# Patient Record
Sex: Female | Born: 1950 | Race: White | Hispanic: No | Marital: Married | Smoking: Never smoker
Health system: Southern US, Community
[De-identification: ages and names within clinical notes are randomized; demographics above are authoritative.]

## PROBLEM LIST (undated history)

## (undated) DIAGNOSIS — F419 Anxiety disorder, unspecified: Secondary | ICD-10-CM

## (undated) DIAGNOSIS — E785 Hyperlipidemia, unspecified: Secondary | ICD-10-CM

## (undated) DIAGNOSIS — H269 Unspecified cataract: Secondary | ICD-10-CM

## (undated) DIAGNOSIS — K219 Gastro-esophageal reflux disease without esophagitis: Secondary | ICD-10-CM

## (undated) HISTORY — DX: Unspecified cataract: H26.9

## (undated) HISTORY — PX: NO PAST SURGERIES: SHX2092

## (undated) HISTORY — PX: BREAST CYST ASPIRATION: SHX578

## (undated) HISTORY — DX: Anxiety disorder, unspecified: F41.9

## (undated) HISTORY — PX: BREAST BIOPSY: SHX20

---

## 2016-09-15 DIAGNOSIS — Z1231 Encounter for screening mammogram for malignant neoplasm of breast: Secondary | ICD-10-CM | POA: Diagnosis not present

## 2016-09-16 DIAGNOSIS — Z124 Encounter for screening for malignant neoplasm of cervix: Secondary | ICD-10-CM | POA: Diagnosis not present

## 2016-09-16 DIAGNOSIS — F411 Generalized anxiety disorder: Secondary | ICD-10-CM | POA: Diagnosis not present

## 2016-09-16 DIAGNOSIS — N952 Postmenopausal atrophic vaginitis: Secondary | ICD-10-CM | POA: Diagnosis not present

## 2016-09-16 DIAGNOSIS — R87610 Atypical squamous cells of undetermined significance on cytologic smear of cervix (ASC-US): Secondary | ICD-10-CM | POA: Diagnosis not present

## 2016-09-16 LAB — HM PAP SMEAR: HM Pap smear: NEGATIVE

## 2016-09-26 DIAGNOSIS — R928 Other abnormal and inconclusive findings on diagnostic imaging of breast: Secondary | ICD-10-CM | POA: Diagnosis not present

## 2016-09-26 LAB — HM MAMMOGRAPHY

## 2016-10-06 DIAGNOSIS — L219 Seborrheic dermatitis, unspecified: Secondary | ICD-10-CM | POA: Diagnosis not present

## 2016-10-06 DIAGNOSIS — B078 Other viral warts: Secondary | ICD-10-CM | POA: Diagnosis not present

## 2016-12-10 DIAGNOSIS — R030 Elevated blood-pressure reading, without diagnosis of hypertension: Secondary | ICD-10-CM | POA: Diagnosis not present

## 2016-12-10 DIAGNOSIS — Z8639 Personal history of other endocrine, nutritional and metabolic disease: Secondary | ICD-10-CM | POA: Diagnosis not present

## 2016-12-10 DIAGNOSIS — M6740 Ganglion, unspecified site: Secondary | ICD-10-CM | POA: Diagnosis not present

## 2016-12-10 DIAGNOSIS — M797 Fibromyalgia: Secondary | ICD-10-CM | POA: Diagnosis not present

## 2016-12-24 DIAGNOSIS — Z1159 Encounter for screening for other viral diseases: Secondary | ICD-10-CM | POA: Diagnosis not present

## 2016-12-24 DIAGNOSIS — R7303 Prediabetes: Secondary | ICD-10-CM | POA: Diagnosis not present

## 2016-12-24 DIAGNOSIS — Z8639 Personal history of other endocrine, nutritional and metabolic disease: Secondary | ICD-10-CM | POA: Diagnosis not present

## 2016-12-24 DIAGNOSIS — Z0000 Encounter for general adult medical examination without abnormal findings: Secondary | ICD-10-CM | POA: Diagnosis not present

## 2016-12-24 DIAGNOSIS — E785 Hyperlipidemia, unspecified: Secondary | ICD-10-CM | POA: Diagnosis not present

## 2016-12-24 DIAGNOSIS — I10 Essential (primary) hypertension: Secondary | ICD-10-CM | POA: Diagnosis not present

## 2016-12-24 DIAGNOSIS — M797 Fibromyalgia: Secondary | ICD-10-CM | POA: Diagnosis not present

## 2016-12-24 LAB — HM HEPATITIS C SCREENING LAB: HM Hepatitis Screen: NEGATIVE

## 2017-02-04 DIAGNOSIS — I10 Essential (primary) hypertension: Secondary | ICD-10-CM | POA: Diagnosis not present

## 2017-07-07 DIAGNOSIS — Z23 Encounter for immunization: Secondary | ICD-10-CM | POA: Diagnosis not present

## 2017-10-13 ENCOUNTER — Ambulatory Visit (INDEPENDENT_AMBULATORY_CARE_PROVIDER_SITE_OTHER): Payer: Medicare Other | Admitting: Physician Assistant

## 2017-10-13 ENCOUNTER — Encounter: Payer: Self-pay | Admitting: Physician Assistant

## 2017-10-13 VITALS — BP 150/90 | HR 68 | Temp 98.5°F | Resp 16 | Ht 62.0 in | Wt 142.0 lb

## 2017-10-13 DIAGNOSIS — Z780 Asymptomatic menopausal state: Secondary | ICD-10-CM | POA: Diagnosis not present

## 2017-10-13 DIAGNOSIS — Z1239 Encounter for other screening for malignant neoplasm of breast: Secondary | ICD-10-CM

## 2017-10-13 DIAGNOSIS — I10 Essential (primary) hypertension: Secondary | ICD-10-CM

## 2017-10-13 DIAGNOSIS — Z0001 Encounter for general adult medical examination with abnormal findings: Secondary | ICD-10-CM | POA: Diagnosis not present

## 2017-10-13 DIAGNOSIS — Z8639 Personal history of other endocrine, nutritional and metabolic disease: Secondary | ICD-10-CM

## 2017-10-13 DIAGNOSIS — H04123 Dry eye syndrome of bilateral lacrimal glands: Secondary | ICD-10-CM

## 2017-10-13 DIAGNOSIS — M6740 Ganglion, unspecified site: Secondary | ICD-10-CM | POA: Diagnosis not present

## 2017-10-13 DIAGNOSIS — Z1211 Encounter for screening for malignant neoplasm of colon: Secondary | ICD-10-CM | POA: Diagnosis not present

## 2017-10-13 DIAGNOSIS — Z1231 Encounter for screening mammogram for malignant neoplasm of breast: Secondary | ICD-10-CM

## 2017-10-13 DIAGNOSIS — E785 Hyperlipidemia, unspecified: Secondary | ICD-10-CM

## 2017-10-13 DIAGNOSIS — R5383 Other fatigue: Secondary | ICD-10-CM | POA: Diagnosis not present

## 2017-10-13 DIAGNOSIS — G4700 Insomnia, unspecified: Secondary | ICD-10-CM | POA: Diagnosis not present

## 2017-10-13 MED ORDER — TRAZODONE HCL 50 MG PO TABS
25.0000 mg | ORAL_TABLET | Freq: Every evening | ORAL | 3 refills | Status: DC | PRN
Start: 2017-10-13 — End: 2017-12-07

## 2017-10-13 NOTE — Patient Instructions (Signed)

## 2017-10-13 NOTE — Progress Notes (Addendum)
Patient: Melanie Rhodes Female    DOB: 1951/03/03   67 y.o.   MRN: [id number] Visit Date: 10/14/2017  Today's Provider: Trinna Post, [name]   Chief Complaint  Patient presents with  . Establish Care   Subjective:    HPI   Melanie Rhodes is a 67 y/o woman presenting today to establish care. She was previously seen in [place], [place], now lives in [place]. She is a retired Glass blower/designer.  She takes restasis for dry eyes.   She said she was previously on 2.5 mg amlodipine previously for elevated BP but her home readings have been normal. She brings in BP log today.  She says she has a history of Graves disease and was on methimazole, but that this had resolved and she hasn't been on any sort of thyroid medication in a while. No history of iodine ablation or thyroidectomy.   She reports she has a cyst on her left foot that has been present for years. She reports she has been told that this is a ganglion cyst. She also reports that she has issues with plantar fasciitis.   She has a history of fibromyalgia. She is currently not on medication for this. She exercises daily by walking 2-3 miles. She does have all over body pain.  She has a history of using klonopin for sleep and also restless legs. She hasn't used klonopin in months however. She is open to trying other medications for sleep aid. She reports at one time she may have tried Requip for restless legs but that it made her nauseated.  She is due for a mammogram. She is due for a DEXA, never had one before. She thinks she had the first pneumonia shot. Declines hepatitis C/HIV screening, says she has gotten this. She is overdue for colonoscopy. She is 66 and no longer requires PAP smears, did not have abnormal PAP. She says she had a tetanus shot within the past 10 years. She reports she had the first pneumonia vaccination, also had zostavax.     No Known Allergies   Current Outpatient Medications:  .  Biotin 1000 MCG  CHEW, Chew by mouth., Disp: , Rfl:  .  cetirizine (KLS ALLER-TEC) 10 MG tablet, Take 10 mg by mouth daily., Disp: , Rfl:  .  Cholecalciferol (VITAMIN D3) 400 units CAPS, Take by mouth., Disp: , Rfl:  .  clonazePAM (KLONOPIN) 1 MG tablet, Take 1 mg by mouth 2 (two) times daily., Disp: , Rfl:  .  cycloSPORINE (RESTASIS) 0.05 % ophthalmic emulsion, 1 drop 2 (two) times daily., Disp: , Rfl:  .  omeprazole (PRILOSEC) 20 MG capsule, Take 20 mg by mouth daily., Disp: , Rfl:  .  vitamin C (ASCORBIC ACID) 500 MG tablet, Take 500 mg by mouth daily., Disp: , Rfl:  .  FLUAD 0.5 ML SUSY, ADM 0.5ML IM UTD, Disp: , Rfl: 0 .  traZODone (DESYREL) 50 MG tablet, Take 0.5-1 tablets (25-50 mg total) by mouth at bedtime as needed for sleep., Disp: 30 tablet, Rfl: 3  Review of Systems  Constitutional: Positive for diaphoresis and fatigue. Negative for activity change, appetite change, chills and fever.  HENT: Positive for postnasal drip, rhinorrhea, sinus pressure and tinnitus. Negative for congestion, dental problem, drooling, ear discharge, ear pain, facial swelling, hearing loss, mouth sores, nosebleeds, sinus pain, sneezing, sore throat, trouble swallowing and voice change.   Eyes: Negative.   Respiratory: Negative.   Cardiovascular: Negative.   Gastrointestinal:  Negative.   Endocrine: Positive for cold intolerance, heat intolerance and polydipsia. Negative for polyphagia and polyuria.  Genitourinary: Negative.   Musculoskeletal: Positive for arthralgias, back pain, myalgias, neck pain and neck stiffness.       History of Fibromyalgia.   Skin: Positive for rash. Negative for color change, pallor and wound.  Allergic/Immunologic: Negative.   Neurological: Positive for dizziness. Negative for tremors, seizures, syncope, facial asymmetry, speech difficulty, weakness, light-headedness, numbness and headaches.  Hematological: Negative.   Psychiatric/Behavioral: Positive for decreased concentration and sleep  disturbance. Negative for agitation, behavioral problems, confusion, dysphoric mood, hallucinations, self-injury and suicidal ideas. The patient is nervous/anxious. The patient is not hyperactive.    Family History  Problem Relation Age of Onset  . GER disease Mother   . Hypertension Mother   . GER disease Father   . Hyperlipidemia Father   . Diabetes Brother    Past Surgical History:  Procedure Laterality Date  . BREAST CYST ASPIRATION Left years ago   neg  . COLONOSCOPY WITH PROPOFOL N/A 11/09/2017   Procedure: COLONOSCOPY WITH PROPOFOL;  Surgeon: Jonathon Bellows, MD;  Location: [facility] ENDOSCOPY;  Service: Gastroenterology;  Laterality: N/A;  . NO PAST SURGERIES      Social History   Tobacco Use  . Smoking status: Never Smoker  . Smokeless tobacco: Never Used  Substance Use Topics  . Alcohol use: Yes    Alcohol/week: 6.0 oz    Types: 7 Glasses of wine, 3 Shots of liquor per week    Frequency: Never   Objective:   BP (!) 150/90   Pulse 68   Temp 98.5 F (36.9 C) (Oral)   Resp 16   Ht 5\' 2"  (1.575 m)   Wt 142 lb (64.4 kg)   BMI 25.97 kg/m  Vitals:   10/13/17 1448 10/14/17 1143  BP: (!) 178/94 (!) 150/90  Pulse: 68   Resp: 16   Temp: 98.5 F (36.9 C)   TempSrc: Oral   Weight: 142 lb (64.4 kg)   Height: 5\' 2"  (1.575 m)    Depression screen [facility] 2/9 10/15/2017 10/15/2017  Decreased Interest 0 0  Down, Depressed, Hopeless 0 0  PHQ - 2 Score 0 0  Altered sleeping - 3  Tired, decreased energy - 1  Change in appetite - 0  Feeling bad or failure about yourself  - 0  Trouble concentrating - 0  Moving slowly or fidgety/restless - 0  Suicidal thoughts - 0  PHQ-9 Score - 4  Difficult doing work/chores - Not difficult at all      Physical Exam  Constitutional: She is oriented to person, place, and time. She appears well-developed and well-nourished.  HENT:  Right Ear: External ear normal.  Left Ear: External ear normal.  Mouth/Throat: Oropharynx is clear and moist. No  oropharyngeal exudate.  Eyes: Conjunctivae are normal.  Neck: Neck supple.  Cardiovascular: Normal rate and regular rhythm.  Pulmonary/Chest: Effort normal and breath sounds normal.  Abdominal: Soft. Bowel sounds are normal.  Musculoskeletal:  Small, well circumscribed cystic lesion on the lateral dorsal aspect of her left foot. Slightly fluctuant and mobile.   Lymphadenopathy:    She has no cervical adenopathy.  Neurological: She is alert and oriented to person, place, and time.  Skin: Skin is warm and dry.  Psychiatric: She has a normal mood and affect. Her behavior is normal.        Assessment & Plan:     1. Annual physical exam  Will need  records from previous PCP, can schedule AWV when they come on to get second pneumonia shot.  2. Hypertension, unspecified type  She can restart her amlodipine 2.5 mg. Will refill as necessary.   - Comprehensive Metabolic Panel (CMET)  3. Colon cancer screening  - Ambulatory referral to Gastroenterology  4. Breast cancer screening  - MM SCREENING MAMMO BILAT W/TOMO AND CAD; Future  5. Post-menopausal  - DG Bone Density; Future  6. Dry eyes  - Ambulatory referral to Ophthalmology  7. History of Graves' disease  - TSH  8. Other fatigue  - CBC With Differential  9. Hyperlipidemia, unspecified hyperlipidemia type  - Lipid Profile  10. Insomnia, unspecified type  - traZODone (DESYREL) 50 MG tablet; Take 0.5-1 tablets (25-50 mg total) by mouth at bedtime as needed for sleep.  Dispense: 30 tablet; Refill: 3  11. Ganglion cyst  Can refer to podiatry when she wishes.  Return in about 1 year (around 10/13/2018) for CPE.  The entirety of the information documented in the History of Present Illness, Review of Systems and Physical Exam were personally obtained by me. Portions of this information were initially documented by Ashley Royalty, CMA and reviewed by me for thoroughness and accuracy.          Trinna Post, [name]    [facility]

## 2017-10-16 ENCOUNTER — Other Ambulatory Visit: Payer: Self-pay

## 2017-10-16 DIAGNOSIS — Z1211 Encounter for screening for malignant neoplasm of colon: Secondary | ICD-10-CM

## 2017-10-26 ENCOUNTER — Encounter: Payer: Self-pay | Admitting: Physician Assistant

## 2017-11-02 ENCOUNTER — Telehealth: Payer: Self-pay | Admitting: Gastroenterology

## 2017-11-02 ENCOUNTER — Encounter: Payer: Self-pay | Admitting: Physician Assistant

## 2017-11-02 NOTE — Telephone Encounter (Signed)
pt is calling stating her rx was $100 and would like generic called in to pharmacy, please call pt

## 2017-11-02 NOTE — Telephone Encounter (Signed)
Rx has been called to pharmacy for [facility] N drink 8 oz every 74min until bowels run clear evening before colonoscopy.

## 2017-11-09 ENCOUNTER — Ambulatory Visit: Payer: Medicare Other | Admitting: Certified Registered Nurse Anesthetist

## 2017-11-09 ENCOUNTER — Encounter: Payer: Self-pay | Admitting: Certified Registered Nurse Anesthetist

## 2017-11-09 ENCOUNTER — Ambulatory Visit
Admission: RE | Admit: 2017-11-09 | Discharge: 2017-11-09 | Disposition: A | Discharge status: Home / Self Care | Payer: Medicare Other | Source: Ambulatory Visit | Attending: Gastroenterology | Admitting: Gastroenterology

## 2017-11-09 ENCOUNTER — Encounter
Admission: RE | Disposition: A | Discharge status: Home / Self Care | Payer: Self-pay | Source: Ambulatory Visit | Attending: Gastroenterology

## 2017-11-09 DIAGNOSIS — K633 Ulcer of intestine: Secondary | ICD-10-CM | POA: Diagnosis not present

## 2017-11-09 DIAGNOSIS — D124 Benign neoplasm of descending colon: Secondary | ICD-10-CM | POA: Diagnosis not present

## 2017-11-09 DIAGNOSIS — K648 Other hemorrhoids: Secondary | ICD-10-CM | POA: Diagnosis not present

## 2017-11-09 DIAGNOSIS — K5730 Diverticulosis of large intestine without perforation or abscess without bleeding: Secondary | ICD-10-CM | POA: Insufficient documentation

## 2017-11-09 DIAGNOSIS — Z1211 Encounter for screening for malignant neoplasm of colon: Secondary | ICD-10-CM | POA: Diagnosis not present

## 2017-11-09 DIAGNOSIS — D120 Benign neoplasm of cecum: Secondary | ICD-10-CM | POA: Diagnosis not present

## 2017-11-09 DIAGNOSIS — K5790 Diverticulosis of intestine, part unspecified, without perforation or abscess without bleeding: Secondary | ICD-10-CM | POA: Diagnosis not present

## 2017-11-09 DIAGNOSIS — K635 Polyp of colon: Secondary | ICD-10-CM | POA: Diagnosis not present

## 2017-11-09 DIAGNOSIS — K640 First degree hemorrhoids: Secondary | ICD-10-CM | POA: Diagnosis not present

## 2017-11-09 HISTORY — PX: COLONOSCOPY WITH PROPOFOL: SHX5780

## 2017-11-09 SURGERY — COLONOSCOPY WITH PROPOFOL
Anesthesia: General

## 2017-11-09 MED ORDER — LIDOCAINE HCL (CARDIAC) 20 MG/ML IV SOLN
INTRAVENOUS | Status: DC | PRN
  Administered 2017-11-09: 50 mg via INTRAVENOUS

## 2017-11-09 MED ORDER — PROPOFOL 10 MG/ML IV BOLUS
INTRAVENOUS | Status: DC | PRN
  Administered 2017-11-09: 20 mg via INTRAVENOUS
  Administered 2017-11-09: 50 mg via INTRAVENOUS

## 2017-11-09 MED ORDER — PROPOFOL 500 MG/50ML IV EMUL
INTRAVENOUS | Status: DC | PRN
  Administered 2017-11-09: 175 ug/kg/min via INTRAVENOUS

## 2017-11-09 MED ORDER — PROPOFOL 500 MG/50ML IV EMUL
INTRAVENOUS | Status: AC
  Filled 2017-11-09: qty 50

## 2017-11-09 MED ORDER — SODIUM CHLORIDE 0.9 % IV SOLN
INTRAVENOUS | Status: DC
  Administered 2017-11-09: 1000 mL via INTRAVENOUS

## 2017-11-09 MED ORDER — PHENYLEPHRINE HCL 10 MG/ML IJ SOLN
INTRAMUSCULAR | Status: DC | PRN
  Administered 2017-11-09: 200 ug via INTRAVENOUS

## 2017-11-09 NOTE — Anesthesia Procedure Notes (Signed)
Date/Time: 11/09/2017 8:52 AM Performed by: Johnna Acosta, CRNA Pre-anesthesia Checklist: Patient identified, Emergency Drugs available, Suction available, Patient being monitored and Timeout performed Patient Re-evaluated:Patient Re-evaluated prior to induction Oxygen Delivery Method: Nasal cannula Preoxygenation: Pre-oxygenation with 100% oxygen

## 2017-11-09 NOTE — Anesthesia Postprocedure Evaluation (Signed)
Anesthesia Post Note  Patient: Melanie Rhodes  Procedure(s) Performed: COLONOSCOPY WITH PROPOFOL (N/A )  Patient location during evaluation: Endoscopy Anesthesia Type: General Level of consciousness: awake and alert Pain management: pain level controlled Vital Signs Assessment: post-procedure vital signs reviewed and stable Respiratory status: spontaneous breathing, nonlabored ventilation, respiratory function stable and patient connected to nasal cannula oxygen Cardiovascular status: blood pressure returned to baseline and stable Postop Assessment: no apparent nausea or vomiting Anesthetic complications: no     Last Vitals:  Vitals:   11/09/17 0941 11/09/17 0951  BP: 138/87 (!) 148/85  Pulse: 72 74  Resp: 12 17  Temp:    SpO2: 100% 100%    Last Pain:  Vitals:   11/09/17 0922  TempSrc: Temporal  PainSc:                  Martha Clan

## 2017-11-09 NOTE — Transfer of Care (Signed)
Immediate Anesthesia Transfer of Care Note  Patient: Melanie Rhodes  Procedure(s) Performed: COLONOSCOPY WITH PROPOFOL (N/A )  Patient Location: PACU  Anesthesia Type:General  Level of Consciousness: awake, alert  and oriented  Airway & Oxygen Therapy: Patient Spontanous Breathing  Post-op Assessment: Report given to RN and Post -op Vital signs reviewed and stable  Post vital signs: Reviewed and stable  Last Vitals:  Vitals:   11/09/17 0921 11/09/17 0922  BP: (!) 101/50 (!) 101/50  Pulse: 84 77  Resp: (!) 21 (!) 21  Temp: (!) 35.8 C (!) 35.9 C  SpO2: 99% 98%    Last Pain:  Vitals:   11/09/17 0922  TempSrc: Temporal  PainSc:          Complications: No apparent anesthesia complications

## 2017-11-09 NOTE — Anesthesia Post-op Follow-up Note (Signed)
Anesthesia QCDR form completed.        

## 2017-11-09 NOTE — Anesthesia Preprocedure Evaluation (Signed)
Anesthesia Evaluation  Patient identified by MRN, date of birth, ID band Patient awake    Reviewed: Allergy & Precautions, H&P , NPO status , Patient's Chart, lab work & pertinent test results, reviewed documented beta blocker date and time   History of Anesthesia Complications Negative for: history of anesthetic complications  Airway Mallampati: I  TM Distance: >3 FB Neck ROM: full    Dental  (+) Caps, Dental Advidsory Given   Pulmonary neg pulmonary ROS,           Cardiovascular Exercise Tolerance: Good (-) hypertension(-) angina(-) CAD, (-) Past MI, (-) Cardiac Stents and (-) CABG + dysrhythmias (palpitations) (-) Valvular Problems/Murmurs     Neuro/Psych PSYCHIATRIC DISORDERS Anxiety negative neurological ROS  negative psych ROS   GI/Hepatic Neg liver ROS, GERD  ,  Endo/Other  negative endocrine ROS  Renal/GU negative Renal ROS  negative genitourinary   Musculoskeletal   Abdominal   Peds  Hematology negative hematology ROS (+)   Anesthesia Other Findings History reviewed. No pertinent past medical history.   Reproductive/Obstetrics negative OB ROS                             Anesthesia Physical Anesthesia Plan  ASA: II  Anesthesia Plan: General   Post-op Pain Management:    Induction: Intravenous  PONV Risk Score and Plan: 3 and Propofol infusion  Airway Management Planned: Nasal Cannula  Additional Equipment:   Intra-op Plan:   Post-operative Plan:   Informed Consent: I have reviewed the patients History and Physical, chart, labs and discussed the procedure including the risks, benefits and alternatives for the proposed anesthesia with the patient or authorized representative who has indicated his/her understanding and acceptance.   Dental Advisory Given  Plan Discussed with: Anesthesiologist, CRNA and Surgeon  Anesthesia Plan Comments:         Anesthesia  Quick Evaluation

## 2017-11-09 NOTE — Op Note (Signed)
[facility] Patient Name: Melanie Rhodes Procedure Date: 11/09/2017 8:45 AM MRN: [id number] Account #: [id number] Date of Birth: July 21, 1951 Admit Type: Outpatient Age: 67 Room: [facility] ENDO ROOM 4 Gender: Female Note Status: Finalized Procedure:            Colonoscopy Indications:          Screening for colorectal malignant neoplasm Providers:            Jonathon Bellows MD, MD Referring MD:         Janine Ores. Rosanna Randy, MD (Referring MD) Medicines:            Monitored Anesthesia Care Complications:        No immediate complications. Procedure:            Pre-Anesthesia Assessment:                       - Prior to the procedure, a History and Physical was                        performed, and patient medications, allergies and                        sensitivities were reviewed. The patient's tolerance of                        previous anesthesia was reviewed.                       - The risks and benefits of the procedure and the                        sedation options and risks were discussed with the                        patient. All questions were answered and informed                        consent was obtained.                       - ASA Grade Assessment: II - A patient with mild                        systemic disease.                       After obtaining informed consent, the colonoscope was                        passed under direct vision. Throughout the procedure,                        the patient's blood pressure, pulse, and oxygen                        saturations were monitored continuously. The                        Colonoscope was introduced through the anus and  advanced to the the cecum, identified by the                        appendiceal orifice, IC valve and transillumination.                        The colonoscopy was performed with ease. The patient                        tolerated the procedure  well. Findings:      The perianal and digital rectal examinations were normal.      Non-bleeding internal hemorrhoids were found during retroflexion. The       hemorrhoids were medium-sized and Grade I (internal hemorrhoids that do       not prolapse).      Multiple medium-mouthed diverticula were found in the sigmoid colon.      Two sessile polyps were found in the descending colon and cecum. The       polyps were 6 to 8 mm in size. These polyps were removed with a cold       snare. Resection and retrieval were complete.      The exam was otherwise without abnormality on direct and retroflexion       views. Impression:           - Non-bleeding internal hemorrhoids.                       - Diverticulosis in the sigmoid colon.                       - Two 6 to 8 mm polyps in the descending colon and in                        the cecum, removed with a cold snare. Resected and                        retrieved.                       - The examination was otherwise normal on direct and                        retroflexion views. Recommendation:       - Discharge patient to home (with escort).                       - Resume previous diet.                       - Continue present medications.                       - Await pathology results.                       - Repeat colonoscopy in 5 years for surveillance. Procedure Code(s):    --- Professional ---                       [phone], Colonoscopy, flexible; with removal of tumor(s),  polyp(s), or other lesion(s) by snare technique Diagnosis Code(s):    --- Professional ---                       Z12.11, Encounter for screening for malignant neoplasm                        of colon                       K64.0, First degree hemorrhoids                       D12.4, Benign neoplasm of descending colon                       D12.0, Benign neoplasm of cecum                       K57.30, Diverticulosis of large intestine without                         perforation or abscess without bleeding CPT copyright 2016 American Medical Association. All rights reserved. The codes documented in this report are preliminary and upon coder review may  be revised to meet current compliance requirements. Jonathon Bellows, MD Jonathon Bellows MD, MD 11/09/2017 9:18:22 AM This report has been signed electronically. Number of Addenda: 0 Note Initiated On: 11/09/2017 8:45 AM Scope Withdrawal Time: 0 hours 14 minutes 22 seconds  Total Procedure Duration: 0 hours 18 minutes 50 seconds       [facility]

## 2017-11-09 NOTE — H&P (Signed)
Melanie Bellows, MD [address] [place], [place], [place], [address] [address], [place], [place], [place], [address] Phone: [phone]  Fax: [phone]  Primary Care Physician:  Trinna Post, [name]   Pre-Procedure History & Physical: HPI:  Melanie Rhodes is a 67 y.o. female is here for an colonoscopy.   History reviewed. No pertinent past medical history.  Past Surgical History:  Procedure Laterality Date  . NO PAST SURGERIES      Prior to Admission medications   Medication Sig Start Date End Date Taking? Authorizing Provider  Biotin 1000 MCG CHEW Chew by mouth.   Yes [provider]  cetirizine (KLS ALLER-TEC) 10 MG tablet Take 10 mg by mouth daily.   Yes [provider]  Cholecalciferol (VITAMIN D3) 400 units CAPS Take by mouth.   Yes [provider]  clonazePAM (KLONOPIN) 1 MG tablet Take 1 mg by mouth 2 (two) times daily.   Yes [provider]  cycloSPORINE (RESTASIS) 0.05 % ophthalmic emulsion 1 drop 2 (two) times daily.   Yes [provider]  FLUAD 0.5 ML SUSY ADM 0.5ML IM UTD 07/07/17  Yes [provider]  omeprazole (PRILOSEC) 20 MG capsule Take 20 mg by mouth daily.   Yes [provider]  traZODone (DESYREL) 50 MG tablet Take 0.5-1 tablets (25-50 mg total) by mouth at bedtime as needed for sleep. 10/13/17  Yes Carles Collet M, [name]  vitamin C (ASCORBIC ACID) 500 MG tablet Take 500 mg by mouth daily.   Yes [provider]    Allergies as of 10/16/2017  . (No Known Allergies)    Family History  Problem Relation Age of Onset  . GER disease Mother   . Hypertension Mother   . GER disease Father   . Hyperlipidemia Father   . Diabetes Brother     Social History   Socioeconomic History  . Marital status: Married    Spouse name: Not on file  . Number of children: Not on file  . Years of education: Not on file  . Highest education level: Not on file  Social Needs    . Financial resource strain: Not on file  . Food insecurity - worry: Not on file  . Food insecurity - inability: Not on file  . Transportation needs - medical: Not on file  . Transportation needs - non-medical: Not on file  Occupational History  . Not on file  Tobacco Use  . Smoking status: Never Smoker  . Smokeless tobacco: Never Used  Substance and Sexual Activity  . Alcohol use: Yes    Alcohol/week: 6.0 oz    Types: 7 Glasses of wine, 3 Shots of liquor per week    Frequency: Never  . Drug use: No  . Sexual activity: Not on file  Other Topics Concern  . Not on file  Social History Narrative  . Not on file    Review of Systems: See HPI, otherwise negative ROS  Physical Exam: BP (!) 154/78   Pulse 72   Temp (!) 96.9 F (36.1 C) (Tympanic)   Resp 16   Ht 5\' 2"  (1.575 m)   Wt 135 lb (61.2 kg)   SpO2 100%   BMI 24.69 kg/m  General:   Alert,  pleasant and cooperative in NAD Head:  Normocephalic and atraumatic. Neck:  Supple; no masses or thyromegaly. Lungs:  Clear throughout to auscultation, normal respiratory effort.    Heart:  +  S1, +S2, Regular rate and rhythm, No edema. Abdomen:  Soft, nontender and nondistended. Normal bowel sounds, without guarding, and without rebound.   Neurologic:  Alert and  oriented x4;  grossly normal neurologically.  Impression/Plan: [name] is here for an colonoscopy to be performed for Screening colonoscopy average risk   Risks, benefits, limitations, and alternatives regarding  colonoscopy have been reviewed with the patient.  Questions have been answered.  All parties agreeable.   Melanie Bellows, MD  11/09/2017, 8:43 AM

## 2017-11-10 ENCOUNTER — Encounter: Payer: Self-pay | Admitting: Gastroenterology

## 2017-11-10 LAB — SURGICAL PATHOLOGY

## 2017-11-12 ENCOUNTER — Encounter: Payer: Self-pay | Admitting: Gastroenterology

## 2017-11-13 DIAGNOSIS — H25012 Cortical age-related cataract, left eye: Secondary | ICD-10-CM | POA: Diagnosis not present

## 2017-11-16 ENCOUNTER — Ambulatory Visit
Admission: RE | Admit: 2017-11-16 | Discharge: 2017-11-16 | Disposition: A | Discharge status: Home / Self Care | Payer: Medicare Other | Source: Ambulatory Visit | Attending: Physician Assistant | Admitting: Physician Assistant

## 2017-11-16 ENCOUNTER — Other Ambulatory Visit: Payer: Self-pay | Admitting: Physician Assistant

## 2017-11-16 DIAGNOSIS — M8588 Other specified disorders of bone density and structure, other site: Secondary | ICD-10-CM | POA: Diagnosis not present

## 2017-11-16 DIAGNOSIS — Z1239 Encounter for other screening for malignant neoplasm of breast: Secondary | ICD-10-CM

## 2017-11-16 DIAGNOSIS — Z1231 Encounter for screening mammogram for malignant neoplasm of breast: Secondary | ICD-10-CM | POA: Insufficient documentation

## 2017-11-16 DIAGNOSIS — Z780 Asymptomatic menopausal state: Secondary | ICD-10-CM | POA: Insufficient documentation

## 2017-11-16 DIAGNOSIS — M8589 Other specified disorders of bone density and structure, multiple sites: Secondary | ICD-10-CM | POA: Diagnosis not present

## 2017-11-17 ENCOUNTER — Telehealth: Payer: Self-pay

## 2017-11-17 NOTE — Telephone Encounter (Signed)
Pt advised.   Thanks,   -Laura  

## 2017-11-17 NOTE — Telephone Encounter (Signed)
-----   Message from Trinna Post, [place] sent at 11/17/2017  8:35 AM EDT ----- Bone density shows osteopenia, which is some bone loss but not enough to start treatment. Please continue with her physical activity. Should be getting daily intake 1200 mg calcium and 800 IU vitamin D through diet and/or supplementation. Repeat DEXA in 2 years.

## 2017-11-24 ENCOUNTER — Other Ambulatory Visit: Payer: Self-pay | Admitting: *Deleted

## 2017-11-24 ENCOUNTER — Inpatient Hospital Stay
Admission: RE | Admit: 2017-11-24 | Discharge: 2017-11-24 | Disposition: A | Discharge status: Home / Self Care | Payer: Self-pay | Source: Ambulatory Visit | Attending: *Deleted | Admitting: *Deleted

## 2017-11-24 DIAGNOSIS — Z9289 Personal history of other medical treatment: Secondary | ICD-10-CM

## 2017-11-26 ENCOUNTER — Ambulatory Visit
Admission: RE | Admit: 2017-11-26 | Discharge: 2017-11-26 | Disposition: A | Discharge status: Home / Self Care | Payer: Medicare Other | Source: Ambulatory Visit | Attending: Physician Assistant | Admitting: Physician Assistant

## 2017-11-26 ENCOUNTER — Other Ambulatory Visit: Payer: Self-pay | Admitting: Physician Assistant

## 2017-11-26 DIAGNOSIS — Z1239 Encounter for other screening for malignant neoplasm of breast: Secondary | ICD-10-CM

## 2017-12-01 ENCOUNTER — Telehealth: Payer: Self-pay | Admitting: Physician Assistant

## 2017-12-01 NOTE — Telephone Encounter (Signed)
Pt called wanting results from her mammogram she had done on march 11.  She checked mychart but no results are in there yet.  Her call back [phone]  Thanks teri

## 2017-12-02 NOTE — Telephone Encounter (Signed)
It was normal, repeat 1 year. The breast center typically sends out a letter to patients.

## 2017-12-02 NOTE — Telephone Encounter (Signed)
Pt advised.   Thanks,   -Sabir Charters  

## 2017-12-07 ENCOUNTER — Other Ambulatory Visit: Payer: Self-pay | Admitting: Physician Assistant

## 2017-12-07 DIAGNOSIS — G4700 Insomnia, unspecified: Secondary | ICD-10-CM

## 2017-12-07 NOTE — Telephone Encounter (Signed)
[facility] pharmacy faxed a refill request for a 90-days supply for the following medication. Thanks CC ° °traZODone (DESYREL) 50 MG tablet  ° °

## 2017-12-08 MED ORDER — TRAZODONE HCL 50 MG PO TABS: 25.0000 mg | ORAL_TABLET | Freq: Every evening | ORAL | 0 refills | Status: DC | PRN

## 2017-12-16 DIAGNOSIS — L219 Seborrheic dermatitis, unspecified: Secondary | ICD-10-CM | POA: Diagnosis not present

## 2017-12-16 DIAGNOSIS — L821 Other seborrheic keratosis: Secondary | ICD-10-CM | POA: Diagnosis not present

## 2017-12-16 DIAGNOSIS — L300 Nummular dermatitis: Secondary | ICD-10-CM | POA: Diagnosis not present

## 2017-12-16 DIAGNOSIS — L814 Other melanin hyperpigmentation: Secondary | ICD-10-CM | POA: Diagnosis not present

## 2017-12-16 DIAGNOSIS — L304 Erythema intertrigo: Secondary | ICD-10-CM | POA: Diagnosis not present

## 2017-12-28 DIAGNOSIS — I10 Essential (primary) hypertension: Secondary | ICD-10-CM | POA: Diagnosis not present

## 2017-12-28 DIAGNOSIS — R5383 Other fatigue: Secondary | ICD-10-CM | POA: Diagnosis not present

## 2017-12-28 DIAGNOSIS — Z8639 Personal history of other endocrine, nutritional and metabolic disease: Secondary | ICD-10-CM | POA: Diagnosis not present

## 2017-12-28 DIAGNOSIS — E785 Hyperlipidemia, unspecified: Secondary | ICD-10-CM | POA: Diagnosis not present

## 2017-12-29 LAB — CBC WITH DIFFERENTIAL
Basophils Absolute: 0 10*3/uL (ref 0.0–0.2)
Basos: 1 %
EOS (ABSOLUTE): 0.1 10*3/uL (ref 0.0–0.4)
Eos: 2 %
Hematocrit: 45 % (ref 34.0–46.6)
Hemoglobin: 14.8 g/dL (ref 11.1–15.9)
Immature Grans (Abs): 0 10*3/uL (ref 0.0–0.1)
Immature Granulocytes: 1 %
Lymphocytes Absolute: 1.8 10*3/uL (ref 0.7–3.1)
Lymphs: 30 %
MCH: 31.2 pg (ref 26.6–33.0)
MCHC: 32.9 g/dL (ref 31.5–35.7)
MCV: 95 fL (ref 79–97)
Monocytes Absolute: 0.6 10*3/uL (ref 0.1–0.9)
Monocytes: 10 %
Neutrophils Absolute: 3.5 10*3/uL (ref 1.4–7.0)
Neutrophils: 56 %
RBC: 4.74 x10E6/uL (ref 3.77–5.28)
RDW: 13.3 % (ref 12.3–15.4)
WBC: 6 10*3/uL (ref 3.4–10.8)

## 2017-12-29 LAB — COMPREHENSIVE METABOLIC PANEL
ALT: 24 IU/L (ref 0–32)
AST: 24 IU/L (ref 0–40)
Albumin/Globulin Ratio: 2.3 — ABNORMAL HIGH (ref 1.2–2.2)
Albumin: 4.8 g/dL (ref 3.6–4.8)
Alkaline Phosphatase: 59 IU/L (ref 39–117)
BUN/Creatinine Ratio: 25 (ref 12–28)
BUN: 18 mg/dL (ref 8–27)
Bilirubin Total: 0.6 mg/dL (ref 0.0–1.2)
CO2: 24 mmol/L (ref 20–29)
Calcium: 9.8 mg/dL (ref 8.7–10.3)
Chloride: 100 mmol/L (ref 96–106)
Creatinine, Ser: 0.71 mg/dL (ref 0.57–1.00)
GFR calc Af Amer: 103 mL/min/{1.73_m2} (ref >59)
GFR calc non Af Amer: 89 mL/min/{1.73_m2} (ref >59)
Globulin, Total: 2.1 g/dL (ref 1.5–4.5)
Glucose: 114 mg/dL — ABNORMAL HIGH (ref 65–99)
Potassium: 3.8 mmol/L (ref 3.5–5.2)
Sodium: 141 mmol/L (ref 134–144)
Total Protein: 6.9 g/dL (ref 6.0–8.5)

## 2017-12-29 LAB — LIPID PANEL
Chol/HDL Ratio: 3.2 ratio (ref 0.0–4.4)
Cholesterol, Total: 260 mg/dL — ABNORMAL HIGH (ref 100–199)
HDL: 82 mg/dL (ref >39)
LDL Calculated: 162 mg/dL — ABNORMAL HIGH (ref 0–99)
Triglycerides: 82 mg/dL (ref 0–149)
VLDL Cholesterol Cal: 16 mg/dL (ref 5–40)

## 2017-12-29 LAB — TSH: TSH: 2.56 u[IU]/mL (ref 0.450–4.500)

## 2017-12-30 NOTE — Addendum Note (Signed)
Addended by: Trinna Post on: 12/30/2017 10:33 AM   Modules accepted: Level of Service

## 2017-12-31 ENCOUNTER — Telehealth: Payer: Self-pay

## 2017-12-31 NOTE — Telephone Encounter (Signed)
-----   Message from Trinna Post, [place] sent at 12/31/2017  9:03 AM EDT ----- Blood glucose slightly elevated. Cholesterol also elevated. Whether or not she will need treatment depends on how well her blood pressure is controlled. TSH is normal. CBC is normal. Can we have her come in for an office visit to recheck her BP after starting amlodipine and get an in office A1C? Then we can discuss if she needs cholesterol medication. Thank you.

## 2017-12-31 NOTE — Telephone Encounter (Signed)
Patient advised of results. Patient says she dd not restart Amlodipine because her home readings were normal. Patient does not want to start cholesterol medication. She has scheduled an appointment to come in tomorrow for A1C check.

## 2018-01-01 ENCOUNTER — Encounter: Payer: Self-pay | Admitting: Physician Assistant

## 2018-01-01 ENCOUNTER — Ambulatory Visit (INDEPENDENT_AMBULATORY_CARE_PROVIDER_SITE_OTHER): Payer: Medicare Other | Admitting: Physician Assistant

## 2018-01-01 VITALS — BP 140/86 | HR 74 | Temp 98.5°F | Resp 14 | Wt 141.0 lb

## 2018-01-01 DIAGNOSIS — R030 Elevated blood-pressure reading, without diagnosis of hypertension: Secondary | ICD-10-CM | POA: Diagnosis not present

## 2018-01-01 DIAGNOSIS — R7303 Prediabetes: Secondary | ICD-10-CM

## 2018-01-01 DIAGNOSIS — E785 Hyperlipidemia, unspecified: Secondary | ICD-10-CM

## 2018-01-01 DIAGNOSIS — R739 Hyperglycemia, unspecified: Secondary | ICD-10-CM | POA: Diagnosis not present

## 2018-01-01 LAB — POCT GLYCOSYLATED HEMOGLOBIN (HGB A1C): Hemoglobin A1C: 5.9

## 2018-01-01 NOTE — Patient Instructions (Signed)
Prediabetes Eating Plan Prediabetes-also called impaired glucose tolerance or impaired fasting glucose-is a condition that causes blood sugar (blood glucose) levels to be higher than normal. Following a healthy diet can help to keep prediabetes under control. It can also help to lower the risk of type 2 diabetes and heart disease, which are increased in people who have prediabetes. Along with regular exercise, a healthy diet:  Promotes weight loss.  Helps to control blood sugar levels.  Helps to improve the way that the body uses insulin.  What do I need to know about this eating plan?  Use the glycemic index (GI) to plan your meals. The index tells you how quickly a food will raise your blood sugar. Choose low-GI foods. These foods take a longer time to raise blood sugar.  Pay close attention to the amount of carbohydrates in the food that you eat. Carbohydrates increase blood sugar levels.  Keep track of how many calories you take in. Eating the right amount of calories will help you to achieve a healthy weight. Losing about 7 percent of your starting weight can help to prevent type 2 diabetes.  You may want to follow a Mediterranean diet. This diet includes a lot of vegetables, lean meats or fish, whole grains, fruits, and healthy oils and fats. What foods can I eat? Grains Whole grains, such as whole-wheat or whole-grain breads, crackers, cereals, and pasta. Unsweetened oatmeal. Bulgur. Barley. Quinoa. Brown rice. Corn or whole-wheat flour tortillas or taco shells. Vegetables Lettuce. Spinach. Peas. Beets. Cauliflower. Cabbage. Broccoli. Carrots. Tomatoes. Squash. Eggplant. Herbs. Peppers. Onions. Cucumbers. Brussels sprouts. Fruits Berries. Bananas. Apples. Oranges. Grapes. Papaya. Mango. Pomegranate. Kiwi. Grapefruit. Cherries. Meats and Other Protein Sources Seafood. Lean meats, such as chicken and turkey or lean cuts of pork and beef. Tofu. Eggs. Nuts. Beans. Dairy Low-fat or  fat-free dairy products, such as yogurt, cottage cheese, and cheese. Beverages Water. Tea. Coffee. Sugar-free or diet soda. Seltzer water. Milk. Milk alternatives, such as soy or almond milk. Condiments Mustard. Relish. Low-fat, low-sugar ketchup. Low-fat, low-sugar barbecue sauce. Low-fat or fat-free mayonnaise. Sweets and Desserts Sugar-free or low-fat pudding. Sugar-free or low-fat ice cream and other frozen treats. Fats and Oils Avocado. Walnuts. Olive oil. The items listed above may not be a complete list of recommended foods or beverages. Contact your dietitian for more options. What foods are not recommended? Grains Refined white flour and flour products, such as bread, pasta, snack foods, and cereals. Beverages Sweetened drinks, such as sweet iced tea and soda. Sweets and Desserts Baked goods, such as cake, cupcakes, pastries, cookies, and cheesecake. The items listed above may not be a complete list of foods and beverages to avoid. Contact your dietitian for more information. This information is not intended to replace advice given to you by your health care provider. Make sure you discuss any questions you have with your health care provider. Document Released: 01/09/2015 Document Revised: 01/31/2016 Document Reviewed: 09/20/2014 Elsevier Interactive Patient Education  2017 Elsevier Inc.  

## 2018-01-01 NOTE — Progress Notes (Signed)
Patient: Melanie Rhodes Female    DOB: [birth year]   67 y.o.   MRN: [id number] Visit Date: 01/01/2018  Today's Provider: Trinna Post, [name]   Chief Complaint  Patient presents with  . Hypertension  . Hyperglycemia   Subjective:    HPI   Hypertension, follow-up:  BP Readings from Last 3 Encounters:  01/01/18 140/86  11/09/17 (!) 148/85  10/14/17 (!) 150/90    She was last seen for hypertension 2 months ago.  BP at that visit was 148/85. Management since that visit includes restarted Amlodpine, however pt reported that BP were normal at home and did not start the medication. She reports good compliance with treatment. She is not having side effects.  She is exercising, walking almost every day. She is adherent to low salt diet.   Outside blood pressures are running 120-130's/70-80's. Patient denies chest pain, chest pressure/discomfort, claudication, dyspnea, exertional chest pressure/discomfort, fatigue, irregular heart beat, lower extremity edema, near-syncope, orthopnea, palpitations, paroxysmal nocturnal dyspnea, syncope and tachypnea.    Wt Readings from Last 3 Encounters:  01/01/18 141 lb (64 kg)  11/09/17 135 lb (61.2 kg)  10/13/17 142 lb (64.4 kg)   ------------------------------------------------------------------------  Pt also following up on her blood sugar. Her blood sugar was elevated at last checked on 12/28/17. A1c today is 5.9% which is prediabetic. She reports some habits that could be modified including eating steak regularly and nightly ice cream with toppings.   No Known Allergies   Current Outpatient Medications:  .  Biotin 1000 MCG CHEW, Chew by mouth., Disp: , Rfl:  .  cetirizine (KLS ALLER-TEC) 10 MG tablet, Take 10 mg by mouth daily., Disp: , Rfl:  .  Cholecalciferol (VITAMIN D3) 400 units CAPS, Take by mouth., Disp: , Rfl:  .  cycloSPORINE (RESTASIS) 0.05 % ophthalmic emulsion, 1 drop 2 (two) times daily., Disp: , Rfl:  .  ranitidine  (ZANTAC) 150 MG tablet, Take 150 mg by mouth 2 (two) times daily as needed for heartburn., Disp: , Rfl:  .  traZODone (DESYREL) 50 MG tablet, Take 0.5-1 tablets (25-50 mg total) by mouth at bedtime as needed for sleep., Disp: 90 tablet, Rfl: 0 .  clonazePAM (KLONOPIN) 1 MG tablet, Take 1 mg by mouth 2 (two) times daily., Disp: , Rfl:  .  omeprazole (PRILOSEC) 20 MG capsule, Take 20 mg by mouth daily., Disp: , Rfl:  .  vitamin C (ASCORBIC ACID) 500 MG tablet, Take 500 mg by mouth daily., Disp: , Rfl:   Review of Systems  Constitutional: Negative.   HENT: Negative.   Eyes: Negative.   Respiratory: Negative.   Cardiovascular: Negative.   Gastrointestinal: Negative.   Endocrine: Negative.   Genitourinary: Negative.   Musculoskeletal: Negative.   Skin: Negative.   Allergic/Immunologic: Negative.   Neurological: Negative.   Hematological: Negative.   Psychiatric/Behavioral: Negative.     Social History   Tobacco Use  . Smoking status: Never Smoker  . Smokeless tobacco: Never Used  Substance Use Topics  . Alcohol use: Yes    Alcohol/week: 6.0 oz    Types: 7 Glasses of wine, 3 Shots of liquor per week    Frequency: Never   Objective:   BP 140/86 (BP Location: Left Arm, Patient Position: Sitting, Cuff Size: Normal)   Pulse 74   Temp 98.5 F (36.9 C) (Oral)   Resp 14   Wt 141 lb (64 kg)   BMI 25.79 kg/m  Vitals:   01/01/18 0915  BP: 140/86  Pulse: 74  Resp: 14  Temp: 98.5 F (36.9 C)  TempSrc: Oral  Weight: 141 lb (64 kg)     Physical Exam  Constitutional: She is oriented to person, place, and time. She appears well-developed and well-nourished.  Cardiovascular: Normal rate and regular rhythm.  Pulmonary/Chest: Effort normal and breath sounds normal.  Neurological: She is alert and oriented to person, place, and time.  Skin: Skin is warm and dry.  Psychiatric: She has a normal mood and affect. Her behavior is normal.        Assessment & Plan:     1.  Prediabetes  A1C prediabetic today. Counseled on possible progression into diabetes. Counseled that with her BP and cholesterol level, she technically does qualify for a statin. Patient exercises regularly but would like to make dietary modifications and recheck in 6 mo.  2. Hyperglycemia  - POCT HgB A1C  3. Hyperlipidemia, unspecified hyperlipidemia type Cholesterol elevated and technically qualifies for statin via her CVD score but she would like to make dietary and lifestyle modifications.   4. Elevated BP without diagnosis of hypertension Patient says her home readings are normal and brings log in today.   Return for HLD, BP.  The entirety of the information documented in the History of Present Illness, Review of Systems and Physical Exam were personally obtained by me. Portions of this information were initially documented by [place], [place] and reviewed by me for thoroughness and accuracy.             Trinna Post, [name]  [facility]

## 2018-01-19 ENCOUNTER — Telehealth: Payer: Self-pay

## 2018-01-19 NOTE — Telephone Encounter (Signed)
LMTCB and see if eligible for an AWV. Need to make sure pt has not had an AWV elsewhere within the last year. If not, ok to schedule AWV and CPE. -MM

## 2018-02-24 NOTE — Telephone Encounter (Signed)
LMTCB. -MM 

## 2018-02-24 NOTE — Telephone Encounter (Signed)
This encounter was created in error - please disregard.

## 2018-03-02 ENCOUNTER — Telehealth: Payer: Self-pay

## 2018-03-02 NOTE — Telephone Encounter (Signed)
Called to schedule the wellness visit and pt states she had her CPE in 10/2017. Not currently eligible for an AWV. Closing TE. -MM

## 2018-03-09 ENCOUNTER — Other Ambulatory Visit: Payer: Self-pay | Admitting: Physician Assistant

## 2018-03-09 DIAGNOSIS — G4700 Insomnia, unspecified: Secondary | ICD-10-CM

## 2018-03-09 NOTE — Telephone Encounter (Signed)
See other TE. -MM

## 2018-05-26 ENCOUNTER — Other Ambulatory Visit: Payer: Self-pay | Admitting: Physician Assistant

## 2018-05-26 DIAGNOSIS — G4700 Insomnia, unspecified: Secondary | ICD-10-CM

## 2018-05-26 MED ORDER — TRAZODONE HCL 50 MG PO TABS: 25.0000 mg | ORAL_TABLET | Freq: Every evening | ORAL | 1 refills | Status: DC | PRN

## 2018-05-26 NOTE — Telephone Encounter (Signed)
Please review for Adriana.   Thanks,   -Kalika Smay  

## 2018-05-27 DIAGNOSIS — Z23 Encounter for immunization: Secondary | ICD-10-CM | POA: Diagnosis not present

## 2018-08-25 ENCOUNTER — Telehealth: Payer: Self-pay | Admitting: Physician Assistant

## 2018-08-25 NOTE — Telephone Encounter (Signed)
I left a message asking the pt to call and schedule AWV-I w/ [facility]. VDM (DD)

## 2018-11-16 ENCOUNTER — Other Ambulatory Visit: Payer: Self-pay | Admitting: Physician Assistant

## 2018-11-16 DIAGNOSIS — Z1231 Encounter for screening mammogram for malignant neoplasm of breast: Secondary | ICD-10-CM

## 2019-02-07 ENCOUNTER — Ambulatory Visit
Admission: RE | Admit: 2019-02-07 | Discharge: 2019-02-07 | Disposition: A | Discharge status: Home / Self Care | Payer: Medicare Other | Source: Ambulatory Visit | Attending: Physician Assistant | Admitting: Physician Assistant

## 2019-02-07 DIAGNOSIS — Z1231 Encounter for screening mammogram for malignant neoplasm of breast: Secondary | ICD-10-CM | POA: Insufficient documentation

## 2019-02-10 ENCOUNTER — Other Ambulatory Visit: Payer: Self-pay | Admitting: Family Medicine

## 2019-02-10 DIAGNOSIS — G4700 Insomnia, unspecified: Secondary | ICD-10-CM

## 2019-02-10 NOTE — Telephone Encounter (Signed)
Please schedule follow up to renew this medication. Can be virtual.

## 2019-02-14 ENCOUNTER — Ambulatory Visit (INDEPENDENT_AMBULATORY_CARE_PROVIDER_SITE_OTHER): Payer: Medicare Other | Admitting: Physician Assistant

## 2019-02-14 VITALS — BP 118/68

## 2019-02-14 DIAGNOSIS — G2581 Restless legs syndrome: Secondary | ICD-10-CM | POA: Diagnosis not present

## 2019-02-14 DIAGNOSIS — R7303 Prediabetes: Secondary | ICD-10-CM

## 2019-02-14 DIAGNOSIS — E785 Hyperlipidemia, unspecified: Secondary | ICD-10-CM | POA: Diagnosis not present

## 2019-02-14 DIAGNOSIS — G4700 Insomnia, unspecified: Secondary | ICD-10-CM

## 2019-02-14 DIAGNOSIS — E0500 Thyrotoxicosis with diffuse goiter without thyrotoxic crisis or storm: Secondary | ICD-10-CM | POA: Diagnosis not present

## 2019-02-14 NOTE — Patient Instructions (Signed)

## 2019-02-14 NOTE — Progress Notes (Signed)
Subjective:    Patient ID: Melanie Rhodes, female    DOB: 05/14/1951, 68 y.o.   MRN: [id number]  Melanie Rhodes is a 68 y.o. female presenting on 02/14/2019 for Insomnia and Leg Pain  Virtual Visit via Video Note  I connected with Melanie Rhodes on 02/14/19 at  4:00 PM EDT by a video enabled telemedicine application and verified that I am speaking with the correct person using two identifiers.   I discussed the limitations of evaluation and management by telemedicine and the availability of in person appointments. The patient expressed understanding and agreed to proceed.   Patient location: home Provider location: [place] office  Persons involved in the visit: patient, provider   HPI   Presenting for one year follow up for insomnia and leg pain. She reports she is using trazadone PRN for sleep and doing well. She has an urge to move legs at night. She was previously prescribed requip which she took a few times and made her nauseated. Reports she read side effects and then stopped taking medication for fear of these.   Prediabetes: She is working on dietary interventions and exercise.  HLD:   Lipid Panel     Component Value Date/Time   CHOL 260 (H) 12/28/2017 0839   TRIG 82 12/28/2017 0839   HDL 82 12/28/2017 0839   CHOLHDL 3.2 12/28/2017 0839   LDLCALC 162 (H) 12/28/2017 0839   Reports she has a history of Graves disease that she was treated with possibly methimazole at first and then required another medication. Reports she hasn't had an issue in years and has been off medication.   Lab Results  Component Value Date   HGBA1C 5.9 01/01/2018     Social History   Tobacco Use  . Smoking status: Never Smoker  . Smokeless tobacco: Never Used  Substance Use Topics  . Alcohol use: Yes    Alcohol/week: 10.0 standard drinks    Types: 7 Glasses of wine, 3 Shots of liquor per week    Frequency: Never  . Drug use: No    Review of Systems Per HPI unless  specifically indicated above     Objective:    There were no vitals taken for this visit.  Wt Readings from Last 3 Encounters:  01/01/18 141 lb (64 kg)  11/09/17 135 lb (61.2 kg)  10/13/17 142 lb (64.4 kg)    Physical Exam Constitutional:      Appearance: Normal appearance.  Neurological:     Mental Status: She is alert.  Psychiatric:        Mood and Affect: Mood normal.        Behavior: Behavior normal.    Results for orders placed or performed in visit on 06/07/18  HM HEPATITIS C SCREENING LAB  Result Value Ref Range   HM Hepatitis Screen Negative - Patient Reported       Assessment & Plan:  1. Insomnia, unspecified type  Continue trazodone PRN.  2. Restless leg syndrome  Offered requip or gabapentin, she would like to research these.  3. Hyperlipidemia, unspecified hyperlipidemia type  - Lipid Profile  4. Prediabetes  - HgB A1c  5. Graves' disease in remission  - TSH  The entirety of the information documented in the History of Present Illness, Review of Systems and Physical Exam were personally obtained by me. Portions of this information were initially documented by Edd Arbour, CMA and reviewed by me for thoroughness and accuracy.   F/u 1 year  Carles Collet, [name] [facility] 02/23/2019, 12:45 PM

## 2019-03-07 ENCOUNTER — Other Ambulatory Visit: Payer: Self-pay | Admitting: Physician Assistant

## 2019-03-07 DIAGNOSIS — R7303 Prediabetes: Secondary | ICD-10-CM | POA: Diagnosis not present

## 2019-03-07 DIAGNOSIS — E0500 Thyrotoxicosis with diffuse goiter without thyrotoxic crisis or storm: Secondary | ICD-10-CM | POA: Diagnosis not present

## 2019-03-07 DIAGNOSIS — E785 Hyperlipidemia, unspecified: Secondary | ICD-10-CM | POA: Diagnosis not present

## 2019-03-08 ENCOUNTER — Telehealth: Payer: Self-pay

## 2019-03-08 LAB — LIPID PANEL
Chol/HDL Ratio: 3.1 ratio (ref 0.0–4.4)
Cholesterol, Total: 227 mg/dL — ABNORMAL HIGH (ref 100–199)
HDL: 74 mg/dL (ref >39)
LDL Calculated: 141 mg/dL — ABNORMAL HIGH (ref 0–99)
Triglycerides: 62 mg/dL (ref 0–149)
VLDL Cholesterol Cal: 12 mg/dL (ref 5–40)

## 2019-03-08 LAB — HEMOGLOBIN A1C
Est. average glucose Bld gHb Est-mCnc: 120 mg/dL
Hgb A1c MFr Bld: 5.8 % — ABNORMAL HIGH (ref 4.8–5.6)

## 2019-03-08 LAB — TSH: TSH: 2 u[IU]/mL (ref 0.450–4.500)

## 2019-03-08 NOTE — Telephone Encounter (Signed)
Patient has viewed results on mychart 03/08/2019 at 8:37 am.

## 2019-03-08 NOTE — Telephone Encounter (Signed)
-----   Message from Trinna Post, [place] sent at 03/08/2019  8:32 AM EDT ----- Cholesterol elevated slightly but improved from last year and her good cholesterol is very high which we like to see. A1c still in prediabetic range but less than last year. TSH stable. Keep up the good work.

## 2019-05-04 ENCOUNTER — Other Ambulatory Visit: Payer: Self-pay | Admitting: Physician Assistant

## 2019-05-04 DIAGNOSIS — G4700 Insomnia, unspecified: Secondary | ICD-10-CM

## 2019-06-10 ENCOUNTER — Ambulatory Visit (INDEPENDENT_AMBULATORY_CARE_PROVIDER_SITE_OTHER): Payer: Medicare Other | Admitting: Physician Assistant

## 2019-06-10 ENCOUNTER — Other Ambulatory Visit: Payer: Self-pay

## 2019-06-10 DIAGNOSIS — Z23 Encounter for immunization: Secondary | ICD-10-CM | POA: Diagnosis not present

## 2019-08-17 ENCOUNTER — Other Ambulatory Visit: Payer: Self-pay | Admitting: Physician Assistant

## 2019-08-17 DIAGNOSIS — G4700 Insomnia, unspecified: Secondary | ICD-10-CM

## 2019-08-17 MED ORDER — TRAZODONE HCL 50 MG PO TABS: 25.0000 mg | ORAL_TABLET | Freq: Every evening | ORAL | 1 refills | Status: DC | PRN

## 2019-11-04 ENCOUNTER — Ambulatory Visit: Payer: Medicare Other | Attending: Internal Medicine

## 2019-11-04 DIAGNOSIS — Z23 Encounter for immunization: Secondary | ICD-10-CM | POA: Insufficient documentation

## 2019-11-04 NOTE — Progress Notes (Signed)
   Covid-19 Vaccination Clinic  Name:  Melanie Rhodes    MRN: [id number] DOB: 1951-06-08  11/04/2019  [name] was observed post Covid-19 immunization for 15 minutes without incidence. She was provided with Vaccine Information Sheet and instruction to access the V-Safe system.   [name] was instructed to call 911 with any severe reactions post vaccine: Marland Kitchen Difficulty breathing  . Swelling of your face and throat  . A fast heartbeat  . A bad rash all over your body  . Dizziness and weakness    Immunizations Administered    Name Date Dose VIS Date Route   Pfizer COVID-19 Vaccine 11/04/2019 11:53 AM 0.3 mL 08/19/2019 Intramuscular   Manufacturer: [place]   Lot: [id number]   [place]: [id number]

## 2019-11-30 ENCOUNTER — Ambulatory Visit: Payer: Medicare Other | Attending: Internal Medicine

## 2019-11-30 DIAGNOSIS — Z23 Encounter for immunization: Secondary | ICD-10-CM

## 2019-11-30 NOTE — Progress Notes (Signed)
   Covid-19 Vaccination Clinic  Name:  Melanie Rhodes    MRN: [id number] DOB: 12-13-1950  11/30/2019  [name] was observed post Covid-19 immunization for 15 minutes without incident. She was provided with Vaccine Information Sheet and instruction to access the V-Safe system.   [name] was instructed to call 911 with any severe reactions post vaccine: Marland Kitchen Difficulty breathing  . Swelling of face and throat  . A fast heartbeat  . A bad rash all over body  . Dizziness and weakness   Immunizations Administered    Name Date Dose VIS Date Route   Pfizer COVID-19 Vaccine 11/30/2019  3:13 PM 0.3 mL 08/19/2019 Intramuscular   Manufacturer: [employer]   Lot: [id number]   [place]: [id number]

## 2019-12-30 IMAGING — MG MM DIGITAL SCREENING BILAT W/ TOMO W/ CAD
8 of 12 series · 8 of 28 positions shown · non-contrast
Comparison: Previous exam(s).

CLINICAL DATA: Screening.

EXAM:
DIGITAL SCREENING BILATERAL MAMMOGRAM WITH TOMO AND CAD

[R MLO]
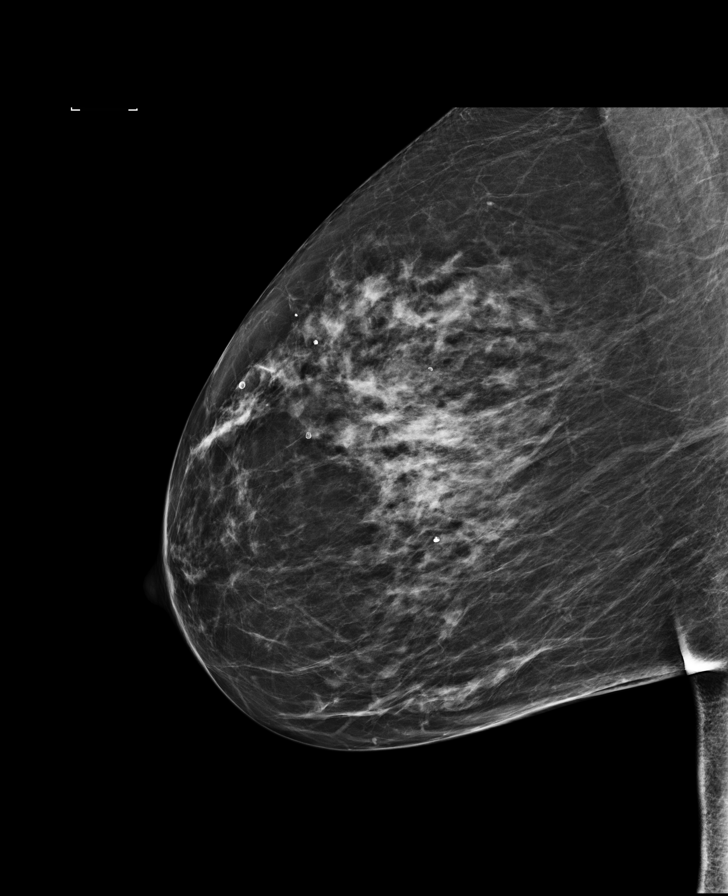

[L CC]
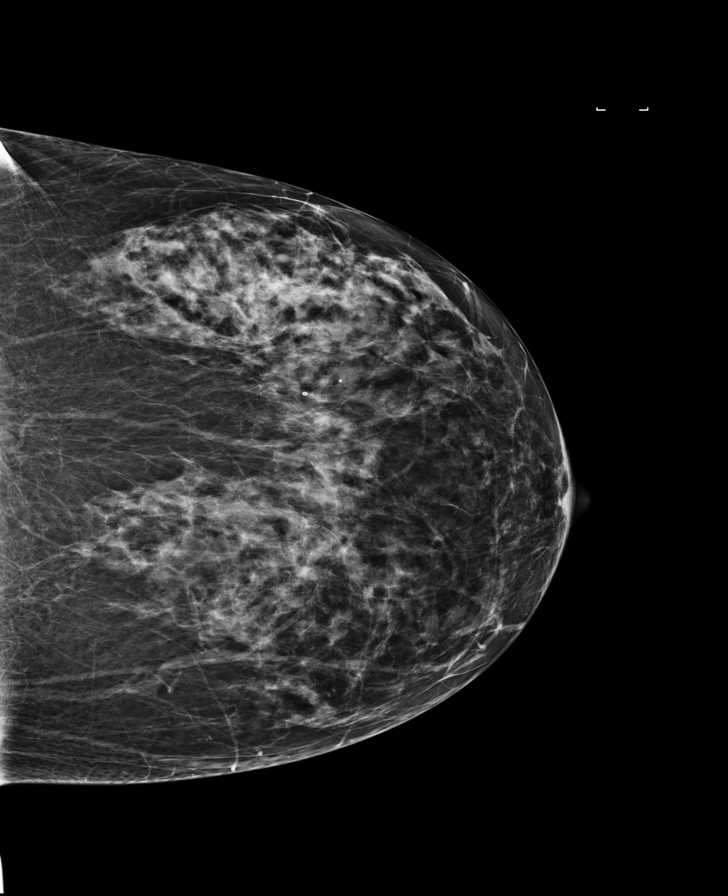

[R CC synth-2D]
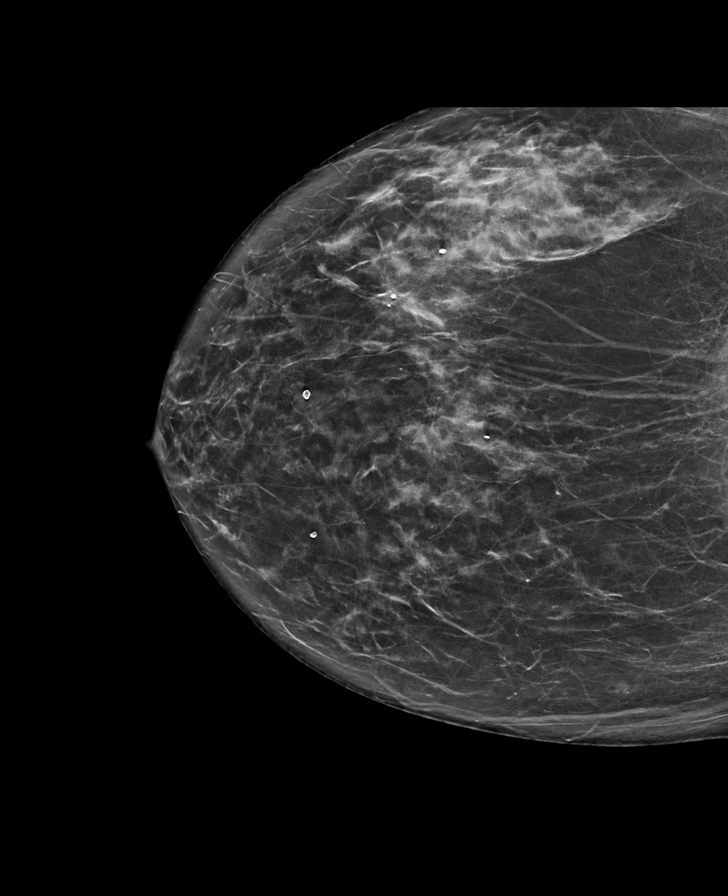

[L MLO]
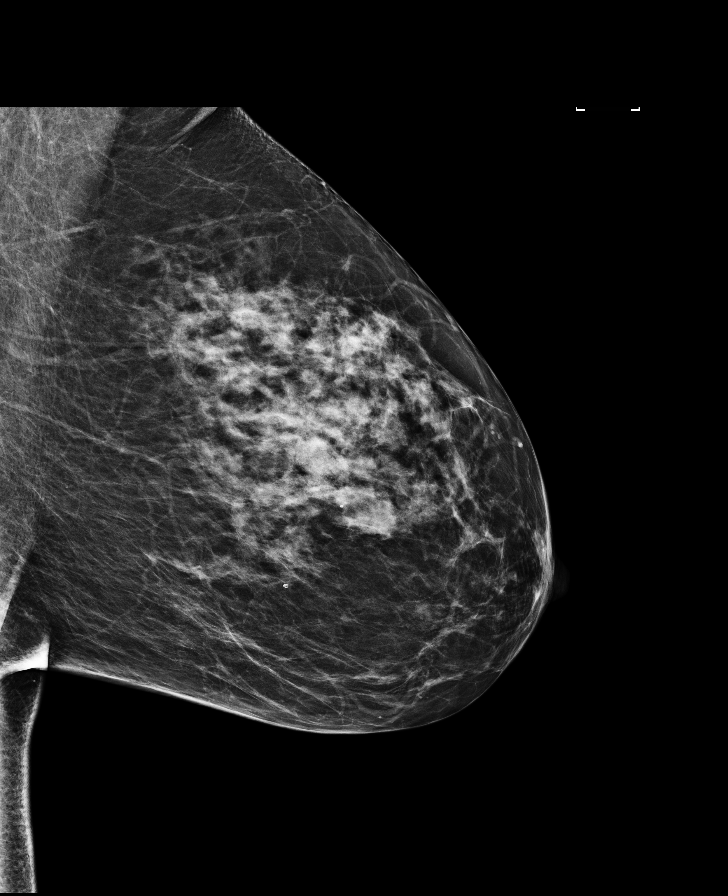

[L MLO synth-2D]
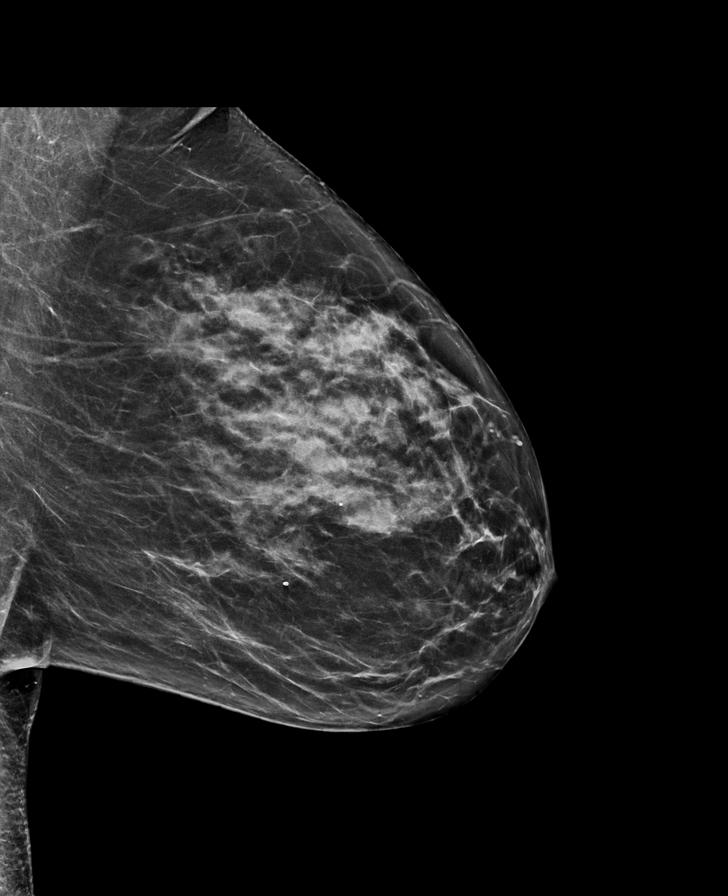

[L CC synth-2D]
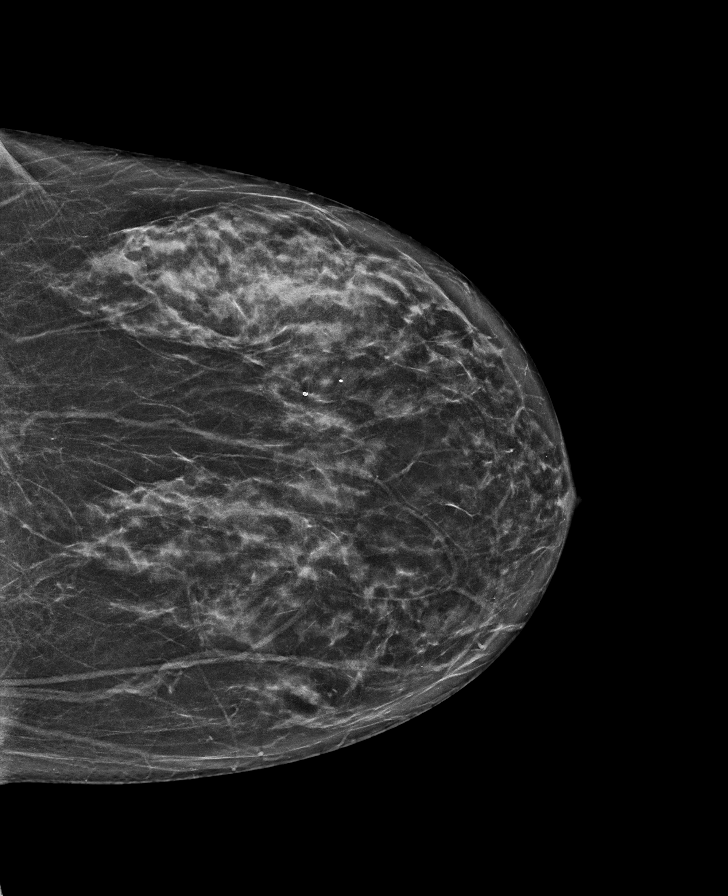

[R MLO synth-2D]
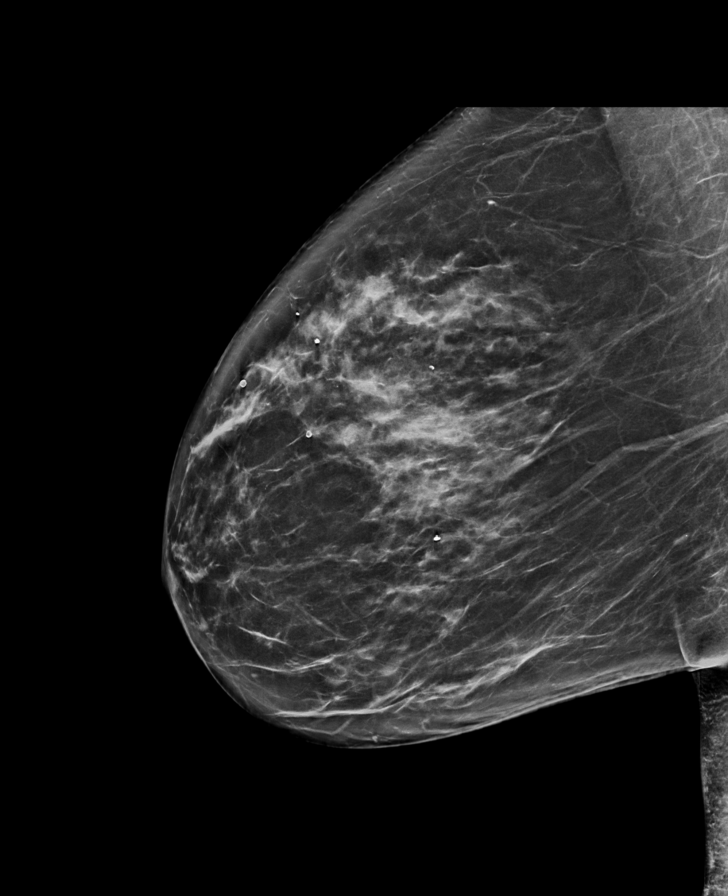

[R CC]
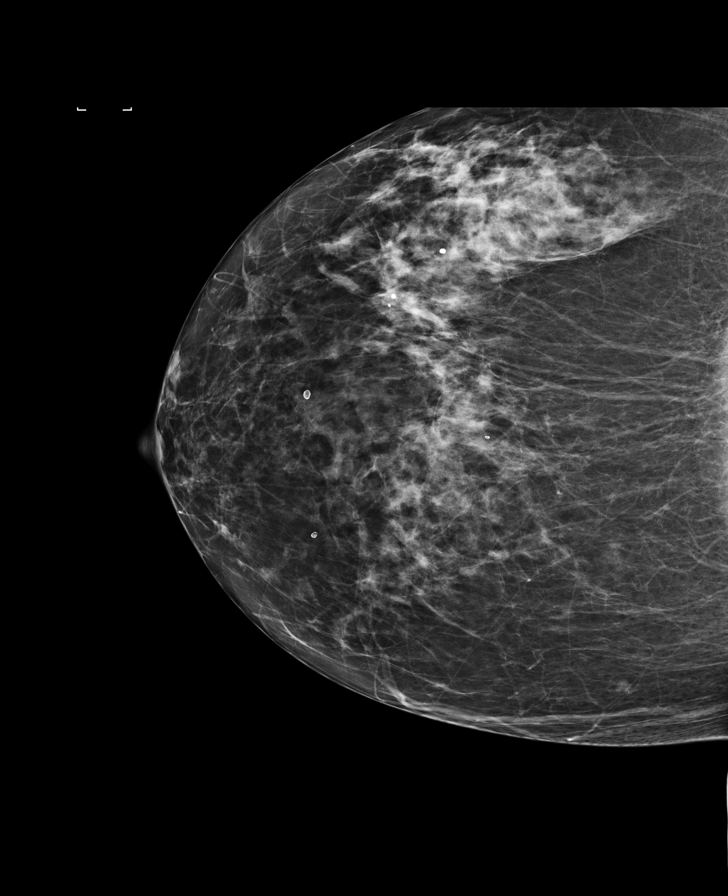

[8 of 28 positions shown; findings below may reference images not displayed]

ACR Breast Density Category c: The breast tissue is heterogeneously
dense, which may obscure small masses.
FINDINGS: There are no findings suspicious for malignancy. Images were
processed with CAD.
IMPRESSION: No mammographic evidence of malignancy. A result letter of this
screening mammogram will be mailed directly to the patient.

RECOMMENDATION:
Screening mammogram in one year. (Code:[id number]-U-LHB)

BI-RADS CATEGORY  1: Negative.

## 2020-01-20 ENCOUNTER — Other Ambulatory Visit: Payer: Self-pay | Admitting: Physician Assistant

## 2020-01-20 DIAGNOSIS — Z1231 Encounter for screening mammogram for malignant neoplasm of breast: Secondary | ICD-10-CM

## 2020-02-09 ENCOUNTER — Ambulatory Visit
Admission: RE | Admit: 2020-02-09 | Discharge: 2020-02-09 | Disposition: A | Discharge status: Home / Self Care | Payer: Medicare Other | Source: Ambulatory Visit | Attending: Physician Assistant | Admitting: Physician Assistant

## 2020-02-09 DIAGNOSIS — Z1231 Encounter for screening mammogram for malignant neoplasm of breast: Secondary | ICD-10-CM | POA: Insufficient documentation

## 2020-02-13 ENCOUNTER — Other Ambulatory Visit: Payer: Self-pay | Admitting: Physician Assistant

## 2020-02-13 DIAGNOSIS — R928 Other abnormal and inconclusive findings on diagnostic imaging of breast: Secondary | ICD-10-CM

## 2020-02-16 ENCOUNTER — Other Ambulatory Visit: Payer: Self-pay | Admitting: Physician Assistant

## 2020-02-16 ENCOUNTER — Ambulatory Visit
Admission: RE | Admit: 2020-02-16 | Discharge: 2020-02-16 | Disposition: A | Discharge status: Home / Self Care | Payer: Medicare Other | Source: Ambulatory Visit | Attending: Physician Assistant | Admitting: Physician Assistant

## 2020-02-16 DIAGNOSIS — R928 Other abnormal and inconclusive findings on diagnostic imaging of breast: Secondary | ICD-10-CM

## 2020-02-16 DIAGNOSIS — N6489 Other specified disorders of breast: Secondary | ICD-10-CM | POA: Diagnosis not present

## 2020-02-16 DIAGNOSIS — R922 Inconclusive mammogram: Secondary | ICD-10-CM | POA: Diagnosis not present

## 2020-02-22 ENCOUNTER — Ambulatory Visit
Admission: RE | Admit: 2020-02-22 | Discharge: 2020-02-22 | Disposition: A | Discharge status: Home / Self Care | Payer: Medicare Other | Source: Ambulatory Visit | Attending: Physician Assistant | Admitting: Physician Assistant

## 2020-02-22 DIAGNOSIS — R928 Other abnormal and inconclusive findings on diagnostic imaging of breast: Secondary | ICD-10-CM | POA: Diagnosis not present

## 2020-02-23 LAB — SURGICAL PATHOLOGY

## 2020-03-05 ENCOUNTER — Ambulatory Visit: Payer: Medicare Other | Admitting: Dermatology

## 2020-04-29 ENCOUNTER — Other Ambulatory Visit: Payer: Self-pay | Admitting: Physician Assistant

## 2020-04-29 DIAGNOSIS — G4700 Insomnia, unspecified: Secondary | ICD-10-CM

## 2020-04-29 NOTE — Telephone Encounter (Signed)
Prescription for trazodone (expired) Pt made appt for 04/30/20 for med refill Last RF 08/17/19 # 90 1 RF expired 11/15/19

## 2020-04-30 ENCOUNTER — Ambulatory Visit: Payer: Medicare Other | Admitting: Physician Assistant

## 2020-05-01 ENCOUNTER — Ambulatory Visit (INDEPENDENT_AMBULATORY_CARE_PROVIDER_SITE_OTHER): Payer: Medicare Other | Admitting: Dermatology

## 2020-05-01 ENCOUNTER — Encounter: Payer: Self-pay | Admitting: Dermatology

## 2020-05-01 ENCOUNTER — Other Ambulatory Visit: Payer: Self-pay

## 2020-05-01 DIAGNOSIS — L219 Seborrheic dermatitis, unspecified: Secondary | ICD-10-CM

## 2020-05-01 DIAGNOSIS — Z1283 Encounter for screening for malignant neoplasm of skin: Secondary | ICD-10-CM

## 2020-05-01 DIAGNOSIS — I781 Nevus, non-neoplastic: Secondary | ICD-10-CM

## 2020-05-01 DIAGNOSIS — D229 Melanocytic nevi, unspecified: Secondary | ICD-10-CM | POA: Diagnosis not present

## 2020-05-01 DIAGNOSIS — L820 Inflamed seborrheic keratosis: Secondary | ICD-10-CM | POA: Diagnosis not present

## 2020-05-01 DIAGNOSIS — D1800 Hemangioma unspecified site: Secondary | ICD-10-CM | POA: Diagnosis not present

## 2020-05-01 DIAGNOSIS — D239 Other benign neoplasm of skin, unspecified: Secondary | ICD-10-CM

## 2020-05-01 DIAGNOSIS — L578 Other skin changes due to chronic exposure to nonionizing radiation: Secondary | ICD-10-CM | POA: Diagnosis not present

## 2020-05-01 DIAGNOSIS — L821 Other seborrheic keratosis: Secondary | ICD-10-CM | POA: Diagnosis not present

## 2020-05-01 DIAGNOSIS — L814 Other melanin hyperpigmentation: Secondary | ICD-10-CM

## 2020-05-01 DIAGNOSIS — D2371 Other benign neoplasm of skin of right lower limb, including hip: Secondary | ICD-10-CM | POA: Diagnosis not present

## 2020-05-01 MED ORDER — HYDROCORTISONE 2.5 % EX CREA: TOPICAL_CREAM | Freq: Two times a day (BID) | CUTANEOUS | 3 refills | Status: DC | PRN

## 2020-05-01 MED ORDER — KETOCONAZOLE 2 % EX SHAM: MEDICATED_SHAMPOO | CUTANEOUS | 0 refills | Status: DC

## 2020-05-01 NOTE — Progress Notes (Signed)
   Follow-Up Visit   Subjective  Melanie Rhodes is a 69 y.o. female who presents for the following: TBSE.  Patient presents today for annual TBSE, also has a few areas of concern on her face, back and shoulders. Patient does not have skin cancer hx.  She tends to pick at the spot on her L cheek.  She uses ketoconazole shampoo for itchy scalp.  She was not able to get the Rx oil drops for her ears, too expensive.  Ears still itch.  The following portions of the chart were reviewed this encounter and updated as appropriate:      Review of Systems:  No other skin or systemic complaints except as noted in HPI or Assessment and Plan.  Objective  Well appearing patient in no apparent distress; mood and affect are within normal limits.  A full examination was performed including scalp, head, eyes, ears, nose, lips, neck, chest, axillae, abdomen, back, buttocks, bilateral upper extremities, bilateral lower extremities, hands, feet, fingers, toes, fingernails, and toenails. All findings within normal limits unless otherwise noted below.  Objective  Left Preauricular Area: Erythematous keratotic or waxy stuck-on papule   Objective  BL legs: Spider veins   Objective  Right Hip x 2 (2): Firm pink/brown papulenodule with dimple sign.   Objective  Head - Anterior (Face), Left Ear: Scalp clear today Pink scaliness in ears   Assessment & Plan    Inflamed seborrheic keratosis Left Preauricular Area  Discussed cryotherapy for removal patient defers at this time    Spider veins BL legs  Benign-appearing.  Observation.  Call clinic for new or changing areas   Dermatofibroma (2) Right Hip x 2  Benign, observe.    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 - Observe - Call for any changes  Melanocytic Nevi - Tan-brown and/or pink-flesh-colored symmetric macules and papules - Benign appearing on exam today - Observation - Call clinic for new or changing moles - Recommend daily use of broad spectrum spf 30+ sunscreen to sun-exposed areas.   Hemangiomas - Red papules - Discussed benign nature - Observe - Call for any changes  Actinic Damage - diffuse scaly erythematous macules with underlying dyspigmentation - Recommend daily broad spectrum sunscreen SPF 30+ to sun-exposed areas, reapply every 2 hours as needed.  - Call for new or changing lesions.  Skin cancer screening performed today.   Return in about 1 year (around 05/01/2021) for TBSE.  I, Donzetta Kohut, CMA, am acting as scribe for Brendolyn Patty, MD .  Documentation: I have reviewed the above documentation for accuracy and completeness, and I agree with the above.  Brendolyn Patty MD

## 2020-05-01 NOTE — Patient Instructions (Addendum)
Recommend daily broad spectrum sunscreen SPF 30+ to sun-exposed areas, reapply every 2 hours as needed. Call for new or changing lesions.  Seborrheic Keratosis  What causes seborrheic keratoses? Seborrheic keratoses are harmless, common skin growths that first appear during adult life.  As time goes by, more growths appear.  Some people may develop a large number of them.  Seborrheic keratoses appear on both covered and uncovered body parts.  They are not caused by sunlight.  The tendency to develop seborrheic keratoses can be inherited.  They vary in color from skin-colored to gray, brown, or even black.  They can be either smooth or have a rough, warty surface.   Seborrheic keratoses are superficial and look as if they were stuck on the skin.  Under the microscope this type of keratosis looks like layers upon layers of skin.  That is why at times the top layer may seem to fall off, but the rest of the growth remains and re-grows.    Treatment Seborrheic keratoses do not need to be treated, but can easily be removed in the office.  Seborrheic keratoses often cause symptoms when they rub on clothing or jewelry.  Lesions can be in the way of shaving.  If they become inflamed, they can cause itching, soreness, or burning.  Removal of a seborrheic keratosis can be accomplished by freezing, burning, or surgery. If any spot bleeds, scabs, or grows rapidly, please return to have it checked, as these can be an indication of a skin cancer.  

## 2020-05-15 ENCOUNTER — Other Ambulatory Visit: Payer: Self-pay

## 2020-05-15 ENCOUNTER — Encounter: Payer: Self-pay | Admitting: Physician Assistant

## 2020-05-15 ENCOUNTER — Ambulatory Visit (INDEPENDENT_AMBULATORY_CARE_PROVIDER_SITE_OTHER): Payer: Medicare Other | Admitting: Physician Assistant

## 2020-05-15 VITALS — BP 151/93 | HR 66 | Temp 98.2°F | Wt 139.0 lb

## 2020-05-15 DIAGNOSIS — R12 Heartburn: Secondary | ICD-10-CM

## 2020-05-15 DIAGNOSIS — Z8639 Personal history of other endocrine, nutritional and metabolic disease: Secondary | ICD-10-CM | POA: Diagnosis not present

## 2020-05-15 DIAGNOSIS — G4700 Insomnia, unspecified: Secondary | ICD-10-CM

## 2020-05-15 DIAGNOSIS — R030 Elevated blood-pressure reading, without diagnosis of hypertension: Secondary | ICD-10-CM

## 2020-05-15 DIAGNOSIS — E7800 Pure hypercholesterolemia, unspecified: Secondary | ICD-10-CM | POA: Diagnosis not present

## 2020-05-15 DIAGNOSIS — Z23 Encounter for immunization: Secondary | ICD-10-CM

## 2020-05-15 DIAGNOSIS — R7303 Prediabetes: Secondary | ICD-10-CM | POA: Diagnosis not present

## 2020-05-15 MED ORDER — OMEPRAZOLE 20 MG PO CPDR: 20.0000 mg | DELAYED_RELEASE_CAPSULE | Freq: Every day | ORAL | 3 refills | Status: DC

## 2020-05-15 MED ORDER — TRAZODONE HCL 50 MG PO TABS: 25.0000 mg | ORAL_TABLET | Freq: Every evening | ORAL | 3 refills | Status: DC | PRN

## 2020-05-15 NOTE — Patient Instructions (Signed)

## 2020-05-15 NOTE — Progress Notes (Signed)
Established patient visit   Patient: Melanie Rhodes   DOB: July 23, 1951   69 y.o. Female  MRN: [id number] Visit Date: 05/15/2020  Today's healthcare provider: Trinna Post, [name]   Chief Complaint  Patient presents with  . Insomnia   Subjective    HPI   Insomnia Follow up  She presents today for follow up of insomnia. She was last seen for this 12 months ago. Management changes included patient reports she started taking a 50 mg daily now. She is not having adverse reaction from treatment.  Insomnia is getting better. She does not have difficulty FALLING asleep. She does not have difficulty STAYING asleep.  She is not taking stimulant medications.  She is not taking new medications:  She is not taking OTC sleeping aid. . She is not taking medications to help sleep. She is not drinking alcohol to help sleep. She is not using illicit drugs.  --------------------------------------------------------------------  History of Graves Disease: Was seen by endocrinologist and was treated with oral medication. Never had ablation. Reports her thyroid levels normalize and have been normal since.   Elevated BP without Diagnosis of HTN: Patient presents with elevated BP in clinic but home readings are normal. She brings a log in today.   BP Readings from Last 3 Encounters:  05/15/20 (!) 151/93  02/23/19 118/68  01/01/18 140/86   GERD, Follow up:  The patient was last seen for GERD 12 months ago. Changes made since that visit include omeprazole 20 mg QD PRN.  She reports good compliance with treatment. She is not having side effects. .  She IS experiencing heartburn. She is NOT experiencing abdominal bloating, chest pain or choking on food  -----------------------------------------------------------------------------------------         Medications: Outpatient Medications Prior to Visit  Medication Sig  . cetirizine (KLS ALLER-TEC) 10 MG tablet Take 10 mg by  mouth daily.  . Cholecalciferol (VITAMIN D3) 400 units CAPS Take by mouth.  . hydrocortisone 2.5 % cream Apply topically 2 (two) times daily as needed (Rash).  Marland Kitchen ketoconazole (NIZORAL) 2 % shampoo Use 2 to 3 times weekly as needed. Leave in for 5 to 10 minutes.  . [DISCONTINUED] omeprazole (PRILOSEC) 20 MG capsule Take 20 mg by mouth daily.  . [DISCONTINUED] traZODone (DESYREL) 50 MG tablet TAKE 0.5-1 TABLETS (25-50 MG TOTAL) BY MOUTH AT BEDTIME AS NEEDED FOR SLEEP.  Marland Kitchen cycloSPORINE (RESTASIS) 0.05 % ophthalmic emulsion 1 drop 2 (two) times daily. (Patient not taking: Reported on 05/01/2020)   No facility-administered medications prior to visit.    Review of Systems  Constitutional: Negative.   Respiratory: Negative.   Cardiovascular: Negative.   Hematological: Negative.       Objective    BP (!) 151/93 (BP Location: Left Arm, Patient Position: Sitting, Cuff Size: Normal)   Pulse 66   Temp 98.2 F (36.8 C) (Oral)   Wt 139 lb (63 kg)   SpO2 95%   BMI 25.42 kg/m    Physical Exam Constitutional:      Appearance: Normal appearance. She is normal weight.  Neck:     Thyroid: No thyroid mass or thyromegaly.  Cardiovascular:     Rate and Rhythm: Normal rate and regular rhythm.     Heart sounds: Normal heart sounds.  Pulmonary:     Effort: Pulmonary effort is normal.     Breath sounds: Normal breath sounds.  Skin:    General: Skin is warm and dry.  Neurological:  General: No focal deficit present.     Mental Status: She is alert and oriented to person, place, and time.  Psychiatric:        Mood and Affect: Mood normal.        Behavior: Behavior normal.       No results found for any visits on 05/15/20.  Assessment & Plan    1. Insomnia, unspecified type  - traZODone (DESYREL) 50 MG tablet; Take 0.5-1 tablets (25-50 mg total) by mouth at bedtime as needed for sleep.  Dispense: 30 tablet; Refill: 3  2. Heart burn  - omeprazole (PRILOSEC) 20 MG capsule; Take 1  capsule (20 mg total) by mouth daily.  Dispense: 30 capsule; Refill: 3  3. Prediabetes  - Comprehensive Metabolic Panel (CMET) - Lipid Profile - TSH - HgB A1c  4. Hypercholesterolemia  - Comprehensive Metabolic Panel (CMET) - Lipid Profile - TSH - HgB A1c  5. Elevated BP without diagnosis of hypertension  - Comprehensive Metabolic Panel (CMET) - Lipid Profile - TSH - HgB A1c  6. History of Graves' disease  - Comprehensive Metabolic Panel (CMET) - Lipid Profile - TSH - HgB A1c  7. Need for influenza vaccination  - Flu Vaccine QUAD High Dose(Fluad)    Return in about 1 year (around 05/15/2021) for chronic .      ITrinna Post, [name], have reviewed all documentation for this visit. The documentation on 05/15/20 for the exam, diagnosis, procedures, and orders are all accurate and complete.  The entirety of the information documented in the History of Present Illness, Review of Systems and Physical Exam were personally obtained by me. Portions of this information were initially documented by [facility] and reviewed by me for thoroughness and accuracy.     Paulene Floor  [facility] [phone] (phone) [phone] (fax)  [place]

## 2020-05-16 ENCOUNTER — Telehealth: Payer: Self-pay | Admitting: Physician Assistant

## 2020-05-16 LAB — LIPID PANEL
Chol/HDL Ratio: 3.3 ratio (ref 0.0–4.4)
Cholesterol, Total: 279 mg/dL — ABNORMAL HIGH (ref 100–199)
HDL: 84 mg/dL (ref >39)
LDL Chol Calc (NIH): 177 mg/dL — ABNORMAL HIGH (ref 0–99)
Triglycerides: 108 mg/dL (ref 0–149)
VLDL Cholesterol Cal: 18 mg/dL (ref 5–40)

## 2020-05-16 LAB — COMPREHENSIVE METABOLIC PANEL
ALT: 22 IU/L (ref 0–32)
AST: 26 IU/L (ref 0–40)
Albumin/Globulin Ratio: 1.9 (ref 1.2–2.2)
Albumin: 4.6 g/dL (ref 3.8–4.8)
Alkaline Phosphatase: 65 IU/L (ref 48–121)
BUN/Creatinine Ratio: 20 (ref 12–28)
BUN: 15 mg/dL (ref 8–27)
Bilirubin Total: 0.6 mg/dL (ref 0.0–1.2)
CO2: 20 mmol/L (ref 20–29)
Calcium: 10 mg/dL (ref 8.7–10.3)
Chloride: 101 mmol/L (ref 96–106)
Creatinine, Ser: 0.75 mg/dL (ref 0.57–1.00)
GFR calc Af Amer: 95 mL/min/{1.73_m2} (ref >59)
GFR calc non Af Amer: 82 mL/min/{1.73_m2} (ref >59)
Globulin, Total: 2.4 g/dL (ref 1.5–4.5)
Glucose: 102 mg/dL — ABNORMAL HIGH (ref 65–99)
Potassium: 4 mmol/L (ref 3.5–5.2)
Sodium: 140 mmol/L (ref 134–144)
Total Protein: 7 g/dL (ref 6.0–8.5)

## 2020-05-16 LAB — HEMOGLOBIN A1C
Est. average glucose Bld gHb Est-mCnc: 123 mg/dL
Hgb A1c MFr Bld: 5.9 % — ABNORMAL HIGH (ref 4.8–5.6)

## 2020-05-16 LAB — TSH: TSH: 1.33 u[IU]/mL (ref 0.450–4.500)

## 2020-05-16 NOTE — Telephone Encounter (Incomplete)
Medication Refill - Medication: trazodone and omeprazole 90 day supply for mail order  Has the patient contacted their pharmacy? Yes.   (Agent: If no, request that the patient contact the pharmacy for the refill.) (Agent: If yes, when and what did the pharmacy advise?)  Preferred Pharmacy (with phone number or street name): [facility] ([place], [place]  Agent: Please be advised that RX refills may take up to 3 business days. We ask that you follow-up with your pharmacy.

## 2020-05-17 ENCOUNTER — Telehealth: Payer: Self-pay | Admitting: Physician Assistant

## 2020-05-17 ENCOUNTER — Encounter: Payer: Self-pay | Admitting: Physician Assistant

## 2020-05-17 DIAGNOSIS — R12 Heartburn: Secondary | ICD-10-CM

## 2020-05-17 DIAGNOSIS — G4700 Insomnia, unspecified: Secondary | ICD-10-CM

## 2020-05-17 MED ORDER — OMEPRAZOLE 20 MG PO CPDR: 20.0000 mg | DELAYED_RELEASE_CAPSULE | Freq: Every day | ORAL | 1 refills | Status: DC

## 2020-05-17 MED ORDER — TRAZODONE HCL 50 MG PO TABS: 25.0000 mg | ORAL_TABLET | Freq: Every evening | ORAL | 1 refills | Status: DC | PRN

## 2020-05-17 NOTE — Telephone Encounter (Signed)
Arbie Cookey calling from [employer] is calling to request a 40 script for omeprazole (PRILOSEC) 20 MG capsule [[id number]] & traZODone (DESYREL) 50 MG tablet [[id number]] needing an updated script electronic or verbal. Please advise CB- [phone] Reference [id number]

## 2020-05-17 NOTE — Telephone Encounter (Signed)
90day supply sent.

## 2020-05-17 NOTE — Telephone Encounter (Signed)
Medication was send into pharmacy on 05/15/2020.

## 2020-05-17 NOTE — Telephone Encounter (Signed)
It was sent on 05/15/2020

## 2020-07-03 DIAGNOSIS — Z23 Encounter for immunization: Secondary | ICD-10-CM | POA: Diagnosis not present

## 2020-07-20 ENCOUNTER — Telehealth: Payer: Self-pay

## 2020-07-20 DIAGNOSIS — N6310 Unspecified lump in the right breast, unspecified quadrant: Secondary | ICD-10-CM

## 2020-07-20 NOTE — Telephone Encounter (Signed)
Copied from [place] [id number]. Topic: Referral - Request for Referral >> Jul 20, 2020  1:45 PM Yvette Rack wrote: Has patient seen PCP for this complaint? yes  *If NO, is insurance requiring patient see PCP for this issue before PCP can refer them? Referral for which specialty: mammogram Preferred provider/office: [facility] Reason for referral: mammogram fu

## 2020-07-23 NOTE — Telephone Encounter (Signed)
Images ordered, follow up from 02/2020.

## 2020-07-23 NOTE — Telephone Encounter (Signed)
Ok to order mammogram 

## 2020-07-24 ENCOUNTER — Other Ambulatory Visit: Payer: Self-pay | Admitting: Physician Assistant

## 2020-07-24 DIAGNOSIS — R12 Heartburn: Secondary | ICD-10-CM

## 2020-07-24 DIAGNOSIS — G4700 Insomnia, unspecified: Secondary | ICD-10-CM

## 2020-07-24 NOTE — Telephone Encounter (Signed)
Order placed

## 2020-07-24 NOTE — Telephone Encounter (Signed)
[facility] states they will need an order for [id number].Right breast diagnostic mammogram TOMO,Thanks

## 2020-07-24 NOTE — Addendum Note (Signed)
Addended by: Trinna Post on: 07/24/2020 02:58 PM   Modules accepted: Orders

## 2020-08-21 DIAGNOSIS — H25811 Combined forms of age-related cataract, right eye: Secondary | ICD-10-CM | POA: Diagnosis not present

## 2020-08-21 DIAGNOSIS — H25812 Combined forms of age-related cataract, left eye: Secondary | ICD-10-CM | POA: Diagnosis not present

## 2020-08-21 DIAGNOSIS — H04123 Dry eye syndrome of bilateral lacrimal glands: Secondary | ICD-10-CM | POA: Diagnosis not present

## 2020-08-21 DIAGNOSIS — H524 Presbyopia: Secondary | ICD-10-CM | POA: Diagnosis not present

## 2020-08-24 ENCOUNTER — Ambulatory Visit: Admission: RE | Admit: 2020-08-24 | Payer: Medicare Other | Source: Ambulatory Visit

## 2020-08-24 ENCOUNTER — Other Ambulatory Visit: Payer: Self-pay

## 2020-08-24 ENCOUNTER — Ambulatory Visit
Admission: RE | Admit: 2020-08-24 | Discharge: 2020-08-24 | Disposition: A | Discharge status: Home / Self Care | Payer: Medicare Other | Source: Ambulatory Visit | Attending: Physician Assistant | Admitting: Physician Assistant

## 2020-08-24 DIAGNOSIS — N6311 Unspecified lump in the right breast, upper outer quadrant: Secondary | ICD-10-CM | POA: Diagnosis not present

## 2020-08-24 DIAGNOSIS — N6310 Unspecified lump in the right breast, unspecified quadrant: Secondary | ICD-10-CM

## 2020-08-24 DIAGNOSIS — R922 Inconclusive mammogram: Secondary | ICD-10-CM | POA: Diagnosis not present

## 2020-10-11 ENCOUNTER — Encounter: Payer: Self-pay | Admitting: Physician Assistant

## 2020-10-19 ENCOUNTER — Other Ambulatory Visit: Payer: Self-pay

## 2020-10-19 ENCOUNTER — Encounter: Payer: Self-pay | Admitting: Physician Assistant

## 2020-10-19 DIAGNOSIS — R12 Heartburn: Secondary | ICD-10-CM

## 2020-10-19 DIAGNOSIS — G4700 Insomnia, unspecified: Secondary | ICD-10-CM

## 2020-10-19 MED ORDER — OMEPRAZOLE 20 MG PO CPDR: 20.0000 mg | DELAYED_RELEASE_CAPSULE | Freq: Every day | ORAL | 0 refills | Status: DC

## 2020-10-19 MED ORDER — TRAZODONE HCL 50 MG PO TABS: ORAL_TABLET | ORAL | 0 refills | Status: DC

## 2020-10-19 NOTE — Telephone Encounter (Signed)
L.O.V. was on 05/15/2020 and no upcoming appointment. Please advise.

## 2020-10-25 NOTE — Progress Notes (Signed)
Subjective:   Melanie Rhodes is a 70 y.o. female who presents for an Initial Medicare Annual Wellness Visit.  I connected with Axelle Szwed today by telephone and verified that I am speaking with the correct person using two identifiers. Location patient: home Location provider: work Persons participating in the virtual visit: patient, provider.   I discussed the limitations, risks, security and privacy concerns of performing an evaluation and management service by telephone and the availability of in person appointments. I also discussed with the patient that there may be a patient responsible charge related to this service. The patient expressed understanding and verbally consented to this telephonic visit.    Interactive audio and video telecommunications were attempted between this provider and patient, however failed, due to patient having technical difficulties OR patient did not have access to video capability.  We continued and completed visit with audio only.   Review of Systems    N/A  Cardiac Risk Factors include: advanced age (>71men, >35 women)     Objective:    There were no vitals filed for this visit. There is no height or weight on file to calculate BMI.  Advanced Directives 10/29/2020 11/09/2017  Does Patient Have a Medical Advance Directive? Yes Yes  Type of Paramedic of [place];Living will Living will  Copy of [place] in Chart? No - copy requested -    Current Medications (verified) Outpatient Encounter Medications as of 10/29/2020  Medication Sig  . cetirizine (ZYRTEC) 10 MG tablet Take 10 mg by mouth daily.  . Cholecalciferol (VITAMIN D3) 400 units CAPS Take by mouth daily at 6 (six) AM.  . hydrocortisone 2.5 % cream Apply topically 2 (two) times daily as needed (Rash).  Marland Kitchen omeprazole (PRILOSEC) 20 MG capsule Take 1 capsule (20 mg total) by mouth daily.  . traZODone (DESYREL) 50 MG tablet Take 1/2 to 1 tablet  (25-50 mg total) by mouth at bedtime as needed for sleep  . vitamin B-12 (CYANOCOBALAMIN) 1000 MCG tablet Take 1,000 mcg by mouth daily.  Marland Kitchen ketoconazole (NIZORAL) 2 % shampoo Use 2 to 3 times weekly as needed. Leave in for 5 to 10 minutes. (Patient not taking: Reported on 10/29/2020)   No facility-administered encounter medications on file as of 10/29/2020.    Allergies (verified) Patient has no known allergies.   History: History reviewed. No pertinent past medical history. Past Surgical History:  Procedure Laterality Date  . BREAST BIOPSY Right 6/16/20231   affirm bx, distortion, x marker, path pending  . BREAST CYST ASPIRATION Left years ago   neg  . COLONOSCOPY WITH PROPOFOL N/A 11/09/2017   Procedure: COLONOSCOPY WITH PROPOFOL;  Surgeon: Jonathon Bellows, MD;  Location: [facility] ENDOSCOPY;  Service: Gastroenterology;  Laterality: N/A;  . NO PAST SURGERIES     Family History  Problem Relation Age of Onset  . GER disease Mother   . Hypertension Mother   . GER disease Father   . Hyperlipidemia Father   . Diabetes Brother    Social History   Socioeconomic History  . Marital status: Married    Spouse name: Not on file  . Number of children: 2  . Years of education: Not on file  . Highest education level: High school graduate  Occupational History  . Occupation: retired  Tobacco Use  . Smoking status: Never Smoker  . Smokeless tobacco: Never Used  Vaping Use  . Vaping Use: Never used  Substance and Sexual Activity  . Alcohol use:  Yes    Alcohol/week: 7.0 standard drinks    Types: 7 Glasses of wine per week    Comment: 1 glass of wine every day  . Drug use: No  . Sexual activity: Not on file  Other Topics Concern  . Not on file  Social History Narrative  . Not on file   Social Determinants of Health   Financial Resource Strain: Low Risk   . Difficulty of Paying Living Expenses: Not hard at all  Food Insecurity: No Food Insecurity  . Worried About Charity fundraiser in  the Last Year: Never true  . Ran Out of Food in the Last Year: Never true  Transportation Needs: No Transportation Needs  . Lack of Transportation (Medical): No  . Lack of Transportation (Non-Medical): No  Physical Activity: Sufficiently Active  . Days of Exercise per Week: 5 days  . Minutes of Exercise per Session: 40 min  Stress: Stress Concern Present  . Feeling of Stress : To some extent  Social Connections: Moderately Isolated  . Frequency of Communication with Friends and Family: More than three times a week  . Frequency of Social Gatherings with Friends and Family: Three times a week  . Attends Religious Services: Never  . Active Member of Clubs or Organizations: No  . Attends Archivist Meetings: Never  . Marital Status: Married    Tobacco Counseling Counseling given: Not Answered   Clinical Intake:  Pre-visit preparation completed: Yes  Pain : No/denies pain     Nutritional Risks: None Diabetes: No  How often do you need to have someone help you when you read instructions, pamphlets, or other written materials from your doctor or pharmacy?: 1 - Never  Diabetic? No  Interpreter Needed?: No  Information entered by :: [facility], LPN   Activities of Daily Living In your present state of health, do you have any difficulty performing the following activities: 10/29/2020  Hearing? N  Vision? N  Difficulty concentrating or making decisions? N  Walking or climbing stairs? N  Dressing or bathing? N  Doing errands, shopping? N  Preparing Food and eating ? N  Using the Toilet? N  In the past six months, have you accidently leaked urine? Y  Comment Occasionally with pressure.  Do you have problems with loss of bowel control? N  Managing your Medications? N  Managing your Finances? N  Housekeeping or managing your Housekeeping? N  Some recent data might be hidden    Patient Care Team: Paulene Floor as PCP - General (Physician  Assistant) Brendolyn Patty, MD (Dermatology) System, Provider Not In  Indicate any recent Medical Services you may have received from other than [facility] providers in the past year (date may be approximate).     Assessment:   This is a routine wellness examination for Melanie Rhodes.  Hearing/Vision screen No exam data present  Dietary issues and exercise activities discussed: Current Exercise Habits: Home exercise routine, Type of exercise: walking, Time (Minutes): 45, Frequency (Times/Week): 5, Weekly Exercise (Minutes/Week): 225, Intensity: Mild, Exercise limited by: None identified  Goals    . DIET - INCREASE WATER INTAKE     Recommend to drink at least 6-8 8oz glasses of water per day.      Depression Screen PHQ 2/9 Scores 10/29/2020 05/15/2020 10/15/2017 10/15/2017  PHQ - 2 Score 0 0 0 0  PHQ- 9 Score - 0 - 4    Fall Risk Fall Risk  10/29/2020 10/15/2017  Falls in the  past year? 0 No  Number falls in past yr: 0 -  Injury with Fall? 0 -    FALL RISK PREVENTION PERTAINING TO THE HOME:  Any stairs in or around the home? Yes  If so, are there any without handrails? No  Home free of loose throw rugs in walkways, pet beds, electrical cords, etc? Yes  Adequate lighting in your home to reduce risk of falls? Yes   ASSISTIVE DEVICES UTILIZED TO PREVENT FALLS:  Life alert? No  Use of a cane, walker or w/c? No  Grab bars in the bathroom? Yes  Shower chair or bench in shower? No  Elevated toilet seat or a handicapped toilet? Yes    Cognitive Function: Normal cognitive status assessed by observation by this Nurse Health Advisor. No abnormalities found.          Immunizations Immunization History  Administered Date(s) Administered  . Fluad Quad(high Dose 65+) 06/10/2019, 05/15/2020  . Influenza, High Dose Seasonal PF 05/27/2018  . Influenza-Unspecified 05/27/2018  . Meningococcal Conjugate 12/24/2016  . PFIZER(Purple Top)SARS-COV-2 Vaccination 11/04/2019, 11/30/2019, 07/03/2020  .  Pneumococcal Polysaccharide-23 05/27/2018  . Zoster 11/01/2015    TDAP status: Due, Education has been provided regarding the importance of this vaccine. Advised may receive this vaccine at local pharmacy or Health Dept. Aware to provide a copy of the vaccination record if obtained from local pharmacy or Health Dept. Verbalized acceptance and understanding.  Flu Vaccine status: Up to date  Pneumococcal vaccine status: Up to date  Covid-19 vaccine status: Completed vaccines  Qualifies for Shingles Vaccine? Yes   Zostavax completed Yes   Shingrix Completed?: No.    Education has been provided regarding the importance of this vaccine. Patient has been advised to call insurance company to determine out of pocket expense if they have not yet received this vaccine. Advised may also receive vaccine at local pharmacy or Health Dept. Verbalized acceptance and understanding.  Screening Tests Health Maintenance  Topic Date Due  . PNA vac Low Risk Adult (2 of 2 - PCV13) 05/28/2019  . TETANUS/TDAP  05/15/2021 (Originally 05/16/1970)  . COVID-19 Vaccine (4 - Booster for Pfizer series) 01/01/2021  . MAMMOGRAM  02/08/2022  . COLONOSCOPY (Pts 45-89yrs Insurance coverage will need to be confirmed)  11/10/2022  . DEXA SCAN  11/17/2022  . INFLUENZA VACCINE  Completed  . Hepatitis C Screening  Completed    Health Maintenance  Health Maintenance Due  Topic Date Due  . PNA vac Low Risk Adult (2 of 2 - PCV13) 05/28/2019    Colorectal cancer screening: Type of screening: Colonoscopy. Completed 11/09/17. Repeat every 5 years  Mammogram status: Completed 08/24/20. Repeat every year  Bone Density status: Completed 11/16/17. Results reflect: Bone density results: OSTEOPENIA. Repeat every 5 years.  Lung Cancer Screening: (Low Dose CT Chest recommended if Age 74-80 years, 30 pack-year currently smoking OR have quit w/in 15years.) does not qualify.   Additional Screening:  Hepatitis C Screening: Up to  date  Vision Screening: Recommended annual ophthalmology exams for early detection of glaucoma and other disorders of the eye. Is the patient up to date with their annual eye exam?  Yes  Who is the provider or what is the name of the office in which the patient attends annual eye exams? [facility] If pt is not established with a provider, would they like to be referred to a provider to establish care? No .   Dental Screening: Recommended annual dental exams for proper oral hygiene  Community Resource Referral / Chronic Care Management: CRR required this visit?  No   CCM required this visit?  No      Plan:     I have personally reviewed and noted the following in the patient's chart:   . Medical and social history . Use of alcohol, tobacco or illicit drugs  . Current medications and supplements . Functional ability and status . Nutritional status . Physical activity . Advanced directives . List of other physicians . Hospitalizations, surgeries, and ER visits in previous 12 months . Vitals . Screenings to include cognitive, depression, and falls . Referrals and appointments  In addition, I have reviewed and discussed with patient certain preventive protocols, quality metrics, and best practice recommendations. A written personalized care plan for preventive services as well as general preventive health recommendations were provided to patient.     Vicie Cech [place], [place]   3/38/2505   Nurse Notes: Pt states she has received her Prevnar 13 vaccine from previous provider in [facility]. Requested records or date of completion to up date chart.

## 2020-10-29 ENCOUNTER — Other Ambulatory Visit: Payer: Self-pay

## 2020-10-29 ENCOUNTER — Ambulatory Visit (INDEPENDENT_AMBULATORY_CARE_PROVIDER_SITE_OTHER): Payer: Medicare Other

## 2020-10-29 DIAGNOSIS — Z0000 Encounter for general adult medical examination without abnormal findings: Secondary | ICD-10-CM

## 2020-10-29 NOTE — Patient Instructions (Signed)
Melanie Rhodes , Thank you for taking time to come for your Medicare Wellness Visit. I appreciate your ongoing commitment to your health goals. Please review the following plan we discussed and let me know if I can assist you in the future.   Screening recommendations/referrals: Colonoscopy: Up to date, due 11/2022 Mammogram: Up to date, due 08/2021 Bone Density: Up to date, due 11/2022 Recommended yearly ophthalmology/optometry visit for glaucoma screening and checkup Recommended yearly dental visit for hygiene and checkup  Vaccinations: Influenza vaccine: Done 05/15/20 Pneumococcal vaccine: Up to date Tdap vaccine: Currently due, declined receiving. Shingles vaccine: Shingrix discussed. Please contact your pharmacy for coverage information.     Advanced directives: Please bring a copy of your POA (Power of Attorney) and/or Living Will to your next appointment.   Conditions/risks identified: Recommend to increase water intake to 6-8 8 oz glasses a day.   Next appointment: None, declined scheduling a follow up with PCP or an AWV for 2023 at this time.    Preventive Care 70 Years and Older, Female Preventive care refers to lifestyle choices and visits with your health care provider that can promote health and wellness. What does preventive care include?  A yearly physical exam. This is also called an annual well check.  Dental exams once or twice a year.  Routine eye exams. Ask your health care provider how often you should have your eyes checked.  Personal lifestyle choices, including:  Daily care of your teeth and gums.  Regular physical activity.  Eating a healthy diet.  Avoiding tobacco and drug use.  Limiting alcohol use.  Practicing safe sex.  Taking low-dose aspirin every day.  Taking vitamin and mineral supplements as recommended by your health care provider. What happens during an annual well check? The services and screenings done by your health care provider  during your annual well check will depend on your age, overall health, lifestyle risk factors, and family history of disease. Counseling  Your health care provider may ask you questions about your:  Alcohol use.  Tobacco use.  Drug use.  Emotional well-being.  Home and relationship well-being.  Sexual activity.  Eating habits.  History of falls.  Memory and ability to understand (cognition).  Work and work Statistician.  Reproductive health. Screening  You may have the following tests or measurements:  Height, weight, and BMI.  Blood pressure.  Lipid and cholesterol levels. These may be checked every 5 years, or more frequently if you are over 1 years old.  Skin check.  Lung cancer screening. You may have this screening every year starting at age 15 if you have a 30-pack-year history of smoking and currently smoke or have quit within the past 15 years.  Fecal occult blood test (FOBT) of the stool. You may have this test every year starting at age 70.  Flexible sigmoidoscopy or colonoscopy. You may have a sigmoidoscopy every 5 years or a colonoscopy every 10 years starting at age 30.  Hepatitis C blood test.  Hepatitis B blood test.  Sexually transmitted disease (STD) testing.  Diabetes screening. This is done by checking your blood sugar (glucose) after you have not eaten for a while (fasting). You may have this done every 1-3 years.  Bone density scan. This is done to screen for osteoporosis. You may have this done starting at age 70.  Mammogram. This may be done every 1-2 years. Talk to your health care provider about how often you should have regular mammograms. Talk with your  health care provider about your test results, treatment options, and if necessary, the need for more tests. Vaccines  Your health care provider may recommend certain vaccines, such as:  Influenza vaccine. This is recommended every year.  Tetanus, diphtheria, and acellular pertussis  (Tdap, Td) vaccine. You may need a Td booster every 10 years.  Zoster vaccine. You may need this after age 18.  Pneumococcal 13-valent conjugate (PCV13) vaccine. One dose is recommended after age 70.  Pneumococcal polysaccharide (PPSV23) vaccine. One dose is recommended after age 70. Talk to your health care provider about which screenings and vaccines you need and how often you need them. This information is not intended to replace advice given to you by your health care provider. Make sure you discuss any questions you have with your health care provider. Document Released: 09/21/2015 Document Revised: 05/14/2016 Document Reviewed: 06/26/2015 Elsevier Interactive Patient Education  2017 [employer] Prevention in the Home Falls can cause injuries. They can happen to people of all ages. There are many things you can do to make your home safe and to help prevent falls. What can I do on the outside of my home?  Regularly fix the edges of walkways and driveways and fix any cracks.  Remove anything that might make you trip as you walk through a door, such as a raised step or threshold.  Trim any bushes or trees on the path to your home.  Use bright outdoor lighting.  Clear any walking paths of anything that might make someone trip, such as rocks or tools.  Regularly check to see if handrails are loose or broken. Make sure that both sides of any steps have handrails.  Any raised decks and porches should have guardrails on the edges.  Have any leaves, snow, or ice cleared regularly.  Use sand or salt on walking paths during winter.  Clean up any spills in your garage right away. This includes oil or grease spills. What can I do in the bathroom?  Use night lights.  Install grab bars by the toilet and in the tub and shower. Do not use towel bars as grab bars.  Use non-skid mats or decals in the tub or shower.  If you need to sit down in the shower, use a plastic, non-slip  stool.  Keep the floor dry. Clean up any water that spills on the floor as soon as it happens.  Remove soap buildup in the tub or shower regularly.  Attach bath mats securely with double-sided non-slip rug tape.  Do not have throw rugs and other things on the floor that can make you trip. What can I do in the bedroom?  Use night lights.  Make sure that you have a light by your bed that is easy to reach.  Do not use any sheets or blankets that are too big for your bed. They should not hang down onto the floor.  Have a firm chair that has side arms. You can use this for support while you get dressed.  Do not have throw rugs and other things on the floor that can make you trip. What can I do in the kitchen?  Clean up any spills right away.  Avoid walking on wet floors.  Keep items that you use a lot in easy-to-reach places.  If you need to reach something above you, use a strong step stool that has a grab bar.  Keep electrical cords out of the way.  Do not use  floor polish or wax that makes floors slippery. If you must use wax, use non-skid floor wax.  Do not have throw rugs and other things on the floor that can make you trip. What can I do with my stairs?  Do not leave any items on the stairs.  Make sure that there are handrails on both sides of the stairs and use them. Fix handrails that are broken or loose. Make sure that handrails are as long as the stairways.  Check any carpeting to make sure that it is firmly attached to the stairs. Fix any carpet that is loose or worn.  Avoid having throw rugs at the top or bottom of the stairs. If you do have throw rugs, attach them to the floor with carpet tape.  Make sure that you have a light switch at the top of the stairs and the bottom of the stairs. If you do not have them, ask someone to add them for you. What else can I do to help prevent falls?  Wear shoes that:  Do not have high heels.  Have rubber bottoms.  Are  comfortable and fit you well.  Are closed at the toe. Do not wear sandals.  If you use a stepladder:  Make sure that it is fully opened. Do not climb a closed stepladder.  Make sure that both sides of the stepladder are locked into place.  Ask someone to hold it for you, if possible.  Clearly mark and make sure that you can see:  Any grab bars or handrails.  First and last steps.  Where the edge of each step is.  Use tools that help you move around (mobility aids) if they are needed. These include:  Canes.  Walkers.  Scooters.  Crutches.  Turn on the lights when you go into a dark area. Replace any light bulbs as soon as they burn out.  Set up your furniture so you have a clear path. Avoid moving your furniture around.  If any of your floors are uneven, fix them.  If there are any pets around you, be aware of where they are.  Review your medicines with your doctor. Some medicines can make you feel dizzy. This can increase your chance of falling. Ask your doctor what other things that you can do to help prevent falls. This information is not intended to replace advice given to you by your health care provider. Make sure you discuss any questions you have with your health care provider. Document Released: 06/21/2009 Document Revised: 01/31/2016 Document Reviewed: 09/29/2014 Elsevier Interactive Patient Education  2017 [employer].

## 2021-01-22 ENCOUNTER — Telehealth: Payer: Self-pay | Admitting: Physician Assistant

## 2021-01-22 DIAGNOSIS — R928 Other abnormal and inconclusive findings on diagnostic imaging of breast: Secondary | ICD-10-CM

## 2021-01-22 NOTE — Telephone Encounter (Signed)
Order placed. Patient advised.  

## 2021-01-22 NOTE — Telephone Encounter (Signed)
Is it okay to order?  Thanks,   -Lynda Capistran  

## 2021-01-22 NOTE — Telephone Encounter (Signed)
Former patient of Carles Collet called to request a referral to [facility].  Please advise and call patient once referral has been placed.  CB# [phone]

## 2021-01-22 NOTE — Telephone Encounter (Signed)
That's fine, per last mammogram in December she is due for bilateral mammogram in June.

## 2021-02-11 ENCOUNTER — Telehealth: Payer: Self-pay | Admitting: Family Medicine

## 2021-02-11 DIAGNOSIS — R12 Heartburn: Secondary | ICD-10-CM

## 2021-02-11 MED ORDER — OMEPRAZOLE 20 MG PO CPDR: 20.0000 mg | DELAYED_RELEASE_CAPSULE | Freq: Every day | ORAL | 1 refills | Status: DC

## 2021-02-11 NOTE — Telephone Encounter (Signed)
[facility] faxed refill request for the following medications:  omeprazole (PRILOSEC) 20 MG capsule  Last Rx: 10/19/20 Qty: 90 Refills: 0 LOV: 05/15/20 Please advise. Thanks TNP

## 2021-02-12 ENCOUNTER — Other Ambulatory Visit: Payer: Self-pay

## 2021-02-12 ENCOUNTER — Ambulatory Visit
Admission: RE | Admit: 2021-02-12 | Discharge: 2021-02-12 | Disposition: A | Discharge status: Home / Self Care | Payer: Medicare Other | Source: Ambulatory Visit | Attending: Family Medicine | Admitting: Family Medicine

## 2021-02-12 DIAGNOSIS — R928 Other abnormal and inconclusive findings on diagnostic imaging of breast: Secondary | ICD-10-CM

## 2021-02-12 DIAGNOSIS — R922 Inconclusive mammogram: Secondary | ICD-10-CM | POA: Diagnosis not present

## 2021-04-19 ENCOUNTER — Other Ambulatory Visit: Payer: Self-pay

## 2021-04-19 DIAGNOSIS — G4700 Insomnia, unspecified: Secondary | ICD-10-CM

## 2021-04-19 MED ORDER — TRAZODONE HCL 50 MG PO TABS: ORAL_TABLET | ORAL | 1 refills | Status: DC

## 2021-04-19 NOTE — Telephone Encounter (Signed)
[facility] refill request for the following medications:  traZODone (DESYREL) 50 MG tablet   Please advise.  

## 2021-04-19 NOTE — Telephone Encounter (Signed)
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 Ok to refill?

## 2021-05-06 ENCOUNTER — Ambulatory Visit (INDEPENDENT_AMBULATORY_CARE_PROVIDER_SITE_OTHER): Payer: Medicare Other | Admitting: Dermatology

## 2021-05-06 ENCOUNTER — Other Ambulatory Visit: Payer: Self-pay

## 2021-05-06 DIAGNOSIS — L814 Other melanin hyperpigmentation: Secondary | ICD-10-CM | POA: Diagnosis not present

## 2021-05-06 DIAGNOSIS — L821 Other seborrheic keratosis: Secondary | ICD-10-CM | POA: Diagnosis not present

## 2021-05-06 DIAGNOSIS — D229 Melanocytic nevi, unspecified: Secondary | ICD-10-CM

## 2021-05-06 DIAGNOSIS — L578 Other skin changes due to chronic exposure to nonionizing radiation: Secondary | ICD-10-CM

## 2021-05-06 DIAGNOSIS — L820 Inflamed seborrheic keratosis: Secondary | ICD-10-CM

## 2021-05-06 DIAGNOSIS — I781 Nevus, non-neoplastic: Secondary | ICD-10-CM | POA: Diagnosis not present

## 2021-05-06 DIAGNOSIS — L219 Seborrheic dermatitis, unspecified: Secondary | ICD-10-CM | POA: Diagnosis not present

## 2021-05-06 DIAGNOSIS — L853 Xerosis cutis: Secondary | ICD-10-CM | POA: Diagnosis not present

## 2021-05-06 DIAGNOSIS — I8310 Varicose veins of unspecified lower extremity with inflammation: Secondary | ICD-10-CM

## 2021-05-06 DIAGNOSIS — Z1283 Encounter for screening for malignant neoplasm of skin: Secondary | ICD-10-CM | POA: Diagnosis not present

## 2021-05-06 DIAGNOSIS — D2271 Melanocytic nevi of right lower limb, including hip: Secondary | ICD-10-CM

## 2021-05-06 DIAGNOSIS — D1800 Hemangioma unspecified site: Secondary | ICD-10-CM

## 2021-05-06 MED ORDER — KETOCONAZOLE 2 % EX CREA: TOPICAL_CREAM | CUTANEOUS | 2 refills | Status: DC

## 2021-05-06 MED ORDER — HYDROCORTISONE 2.5 % EX CREA: TOPICAL_CREAM | Freq: Two times a day (BID) | CUTANEOUS | 3 refills | Status: DC | PRN

## 2021-05-06 NOTE — Patient Instructions (Addendum)
Seborrheic Dermatitis  Mix hydrocortisone 2.5% with ketaconazole 2% twice a day. If improved, decrease to hydrocortisone and ketaconazole mixed once a day. If still clear, decrease to ketaconazole only.   Seborrheic Keratosis (Apply hydrocortisone cream 2.5% twice a day if becomes irritated until improved)  What causes seborrheic keratoses? Seborrheic keratoses are harmless, common skin growths that first appear during adult life.  As time goes by, more growths appear.  Some people may develop a large number of them.  Seborrheic keratoses appear on both covered and uncovered body parts.  They are not caused by sunlight.  The tendency to develop seborrheic keratoses can be inherited.  They vary in color from skin-colored to gray, brown, or even black.  They can be either smooth or have a rough, warty surface.   Seborrheic keratoses are superficial and look as if they were stuck on the skin.  Under the microscope this type of keratosis looks like layers upon layers of skin.  That is why at times the top layer may seem to fall off, but the rest of the growth remains and re-grows.    Treatment Seborrheic keratoses do not need to be treated, but can easily be removed in the office.  Seborrheic keratoses often cause symptoms when they rub on clothing or jewelry.  Lesions can be in the way of shaving.  If they become inflamed, they can cause itching, soreness, or burning.  Removal of a seborrheic keratosis can be accomplished by freezing, burning, or surgery. If any spot bleeds, scabs, or grows rapidly, please return to have it checked, as these can be an indication of a skin cancer.    Dry Skin Care  What causes dry skin?  Dry skin is common and results from inadequate moisture in the outer skin layers. Dry skin usually results from the excessive loss of moisture from the skin surface. This occurs due to two major factors: Normally the skin's oil glands deposit a layer of oil on the skin's surface.  This layer of oil prevents the loss of moisture from the skin. Exposure to soaps, cleaners, solvents, and disinfectants removes this oily film, allowing water to escape. Water loss from the skin increases when the humidity is low. During winter months we spend a lot of time indoors where the air is heated. Heated air has very low humidity. This also contributes to dry skin.  A tendency for dry skin may accompany such disorders as eczema. Also, as people age, the number of functioning oil glands decreases, and the tendency toward dry skin can be a sensation of skin tightness when emerging from the shower.  How do I manage dry skin?  Humidify your environment. This can be accomplished by using a humidifier in your bedroom at night during winter months. Bathing can actually put moisture back into your skin if done right. Take the following steps while bathing to sooth dry skin: Avoid hot water, which only dries the skin and makes itching worse. Use warm water. Avoid washcloths or extensive rubbing or scrubbing. Use mild soaps like unscented Dove, Oil of Olay, Cetaphil, Basis, or CeraVe. If you take baths rather than showers, rinse off soap residue with clean water before getting out of tub. Once out of the shower/tub, pat dry gently with a soft towel. Leave your skin damp. While still damp, apply any medicated ointment/cream you were prescribed to the affected areas. After you apply your medicated ointment/cream, then apply your moisturizer to your whole body.This is the most important  step in dry skin care. If this is omitted, your skin will continue to be dry. The choice of moisturizer is also very important. In general, lotion will not provider enough moisture to severely dry skin because it is water based. You should use an ointment or cream. Moisturizers should also be unscented. Good choices include Vaseline (plain petrolatum), Aquaphor, Cetaphil, CeraVe, Vanicream, DML Forte, Aveeno moisture, or  Eucerin Cream. Bath oils can be helpful, but do not replace the application of moisturizer after the bath. In addition, they make the tub slippery causing an increased risk for falls. Therefore, we do not recommend their use.   If you have any questions or concerns for your doctor, please call our main line at [phone] and press option 4 to reach your doctor's medical assistant. If no one answers, please leave a voicemail as directed and we will return your call as soon as possible. Messages left after 4 pm will be answered the following business day.   You may also send [place] a message via [employer]. We typically respond to MyChart messages within 1-2 business days.  For prescription refills, please ask your pharmacy to contact our office. Our fax number is [phone].  If you have an urgent issue when the clinic is closed that cannot wait until the next business day, you can page your doctor at the number below.    Please note that while we do our best to be available for urgent issues outside of office hours, we are not available 24/7.   If you have an urgent issue and are unable to reach [place], you may choose to seek medical care at your doctor's office, retail clinic, urgent care center, or emergency room.  If you have a medical emergency, please immediately call 911 or go to the emergency department.  Pager Numbers  - Dr. Nehemiah Massed: [phone]  - Dr. Laurence Ferrari: [phone]  - Dr. Nicole Kindred: [phone]  In the event of inclement weather, please call our main line at [phone] for an update on the status of any delays or closures.  Dermatology Medication Tips: Please keep the boxes that topical medications come in in order to help keep track of the instructions about where and how to use these. Pharmacies typically print the medication instructions only on the boxes and not directly on the medication tubes.   If your medication is too expensive, please contact our office at  [phone] option 4 or send [place] a message through [place].   We are unable to tell what your co-pay for medications will be in advance as this is different depending on your insurance coverage. However, we may be able to find a substitute medication at lower cost or fill out paperwork to get insurance to cover a needed medication.   If a prior authorization is required to get your medication covered by your insurance company, please allow [place] 1-2 business days to complete this process.  Drug prices often vary depending on where the prescription is filled and some pharmacies may offer cheaper prices.  The website www.goodrx.com contains coupons for medications through different pharmacies. The prices here do not account for what the cost may be with help from insurance (it may be cheaper with your insurance), but the website can give you the price if you did not use any insurance.  - You can print the associated coupon and take it with your prescription to the pharmacy.  - You may also stop by our office during regular business hours and  pick up a GoodRx coupon card.  - If you need your prescription sent electronically to a different pharmacy, notify our office through [facility] or by phone at [phone] option 4.

## 2021-05-06 NOTE — Progress Notes (Signed)
Follow-Up Visit   Subjective  Melanie Rhodes is a 70 y.o. female who presents for the following: Annual Exam (Patient presents for TBSE. She has seborrheic dermatitis of the ears and uses 2.5% hydrocortisone cream. No areas of concern today. No history of skin cancer. ). Currently has a itchy rash on her face.  She has a couple of spots that get irritated at times on her jaw and shoulder.   The following portions of the chart were reviewed this encounter and updated as appropriate:       Review of Systems:  No other skin or systemic complaints except as noted in HPI or Assessment and Plan.  Objective  Well appearing patient in no apparent distress; mood and affect are within normal limits.  A full examination was performed including scalp, head, eyes, ears, nose, lips, neck, chest, axillae, abdomen, back, buttocks, bilateral upper extremities, bilateral lower extremities, hands, feet, fingers, toes, fingernails, and toenails. All findings within normal limits unless otherwise noted below.  chin, perioral, alar crease Pink scaly patches    Right Mandible, L shoulder Erythematous keratotic or waxy stuck-on papule   Right 4th Toe 2.47m medium light brown macule   Assessment & Plan  Skin cancer screening performed today.  Actinic Damage - chronic, secondary to cumulative UV radiation exposure/sun exposure over time - diffuse scaly erythematous macules with underlying dyspigmentation - Recommend daily broad spectrum sunscreen SPF 30+ to sun-exposed areas, reapply every 2 hours as needed.  - Recommend staying in the shade or wearing long sleeves, sun glasses (UVA+UVB protection) and wide brim hats (4-inch brim around the entire circumference of the hat). - Call for new or changing lesions.  Lentigines - Scattered tan macules - Due to sun exposure - Benign-appering, observe - Recommend daily broad spectrum sunscreen SPF 30+ to sun-exposed areas, reapply every 2 hours as  needed. - Call for any changes  Xerosis - diffuse xerotic patches - recommend gentle, hydrating skin care - gentle skin care handout given - CeraVe SA, HydroBoost samples given.  Seborrheic Keratoses - Stuck-on, waxy, tan-brown papules and/or plaques  - Benign-appearing - Discussed benign etiology and prognosis. - Observe - Call for any changes  Varicose Veins/Spider Veins - Dilated blue, purple or red veins at the lower extremities - Reassured - Smaller vessels can be treated by sclerotherapy (a procedure to inject a medicine into the veins to make them disappear) if desired, but the treatment is not covered by insurance. Larger vessels may be covered if symptomatic and we would refer to vascular surgeon if treatment desired.  Melanocytic Nevi - Tan-brown and/or pink-flesh-colored symmetric macules and papules, including flesh papule on right shoulder - Benign appearing on exam today - Observation - Call clinic for new or changing moles - Recommend daily use of broad spectrum spf 30+ sunscreen to sun-exposed areas.   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 It tends to be exacerbated by stress and cooler weather.  People who have neurologic disease may experience new onset or exacerbation of  existing seborrheic dermatitis.  The condition is not curable but treatable and can be controlled.   Continue hydrocortisone 2.5% cream qd/bid to red patches on face prn flares dsp 30g 3Rf. (Rx on hold)  Start  ketoconazole 2% cream Apply qd/bid to aas face dsp 60g 2Rf.  hydrocortisone 2.5 % cream - chin, perioral, alar crease Apply topically 2 (two) times daily as needed (Rash).  ketoconazole (NIZORAL) 2 % cream - chin, perioral, alar crease Apply to pink patches on face 1-2 times a day as directed until improved.  Inflamed seborrheic keratosis Right Mandible, L shoulder  Benign-appearing.  Discussed cryotherapy, patient defers today.  Start HC 2.5% Cream qd/bid prn itch.   Nevus Right 4th Toe  Vrs SK  Benign-appearing.  Observation.  Call clinic for new or changing moles.  Recommend daily use of broad spectrum spf 30+ sunscreen to sun-exposed areas.    Return in about 1 year (around 05/06/2022) for TBSE.  IJamesetta Orleans, CMA, am acting as scribe for Brendolyn Patty, MD . Documentation: I have reviewed the above documentation for accuracy and completeness, and I agree with the above.  Brendolyn Patty MD

## 2021-06-27 DIAGNOSIS — Z23 Encounter for immunization: Secondary | ICD-10-CM | POA: Diagnosis not present

## 2021-08-03 ENCOUNTER — Other Ambulatory Visit: Payer: Self-pay | Admitting: Family Medicine

## 2021-08-03 DIAGNOSIS — R12 Heartburn: Secondary | ICD-10-CM

## 2021-08-04 NOTE — Telephone Encounter (Signed)
Last RF 02/11/21 #90 1 RF Sent pt MyChart message to call and make appt.  Requested Prescriptions  Pending Prescriptions Disp Refills   omeprazole (PRILOSEC) 20 MG capsule [Pharmacy Med Name: OMEPRAZOLE DR 20 MG CAPSULE] 90 capsule 1    Sig: TAKE 1 CAPSULE BY MOUTH EVERY DAY     Gastroenterology: Proton Pump Inhibitors Failed - 08/03/2021  9:27 AM      Failed - Valid encounter within last 12 months    Recent Outpatient Visits           1 year ago Insomnia, unspecified type   [place], [place], [place]   2 years ago Insomnia, unspecified type   [place], [place], [place]   3 years ago Prediabetes   [place], [place], [place]   3 years ago Hypertension, unspecified type   [place], Wendee Beavers, [name]       Future Appointments             In 9 months Brendolyn Patty, MD [place]

## 2021-10-19 ENCOUNTER — Other Ambulatory Visit: Payer: Self-pay | Admitting: Family Medicine

## 2021-10-19 DIAGNOSIS — G4700 Insomnia, unspecified: Secondary | ICD-10-CM

## 2021-10-21 NOTE — Telephone Encounter (Signed)
Requested medications are due for refill today.  yes  Requested medications are on the active medications list.  yes  Last refill. 04/19/2021 #90 1 refill  Future visit scheduled.   no  Notes to clinic.  Last seen in office 05/15/2020 by Ms. Pollack. More than 3 months overdue for OV.    Requested Prescriptions  Pending Prescriptions Disp Refills   traZODone (DESYREL) 50 MG tablet [Pharmacy Med Name: TRAZODONE 50 MG TABLET] 90 tablet 1    Sig: Take 1/2 to 1 tablet (25-50 mg total) by mouth at bedtime as needed for sleep     Psychiatry: Antidepressants - Serotonin Modulator Failed - 10/19/2021  5:51 PM      Failed - Valid encounter within last 6 months    Recent Outpatient Visits           1 year ago Insomnia, unspecified type   [place], [place], [place]   2 years ago Insomnia, unspecified type   [place], [place], [place]   3 years ago Prediabetes   [place], [place], [place]   4 years ago Hypertension, unspecified type   [place], Wendee Beavers, [place]       Future Appointments             In 6 months Brendolyn Patty, MD [place]

## 2021-10-21 NOTE — Telephone Encounter (Signed)
Medication last filled 04/19/21, last office visit 05/15/20, Patient is due for office visit. KW

## 2021-10-30 ENCOUNTER — Other Ambulatory Visit: Payer: Self-pay | Admitting: Family Medicine

## 2021-10-30 DIAGNOSIS — R12 Heartburn: Secondary | ICD-10-CM

## 2021-10-30 DIAGNOSIS — G4700 Insomnia, unspecified: Secondary | ICD-10-CM

## 2021-10-30 NOTE — Telephone Encounter (Signed)
Requested medication (s) are due for refill today: overdue encounter   Requested medication (s) are on the active medication list: yes  Last refill:  prilosec- 08/05/21 #90 0 refills, trazadone- 04/19/21 #90 1 refill  Future visit scheduled: yes in 2 weeks  Notes to clinic:  OV scheduled 11/14/21. Courtesy refill due for prilosec, can refill be given for trazadone 3 month supply? Scheduled appt and patient is due to annual physical and AMW. Can OV be changed to physical in addition to medication refill? Patient requesting a call back regarding appt and coding of appt.     Requested Prescriptions  Pending Prescriptions Disp Refills   omeprazole (PRILOSEC) 20 MG capsule [Pharmacy Med Name: OMEPRAZOLE DR 20 MG CAPSULE] 90 capsule 0    Sig: TAKE 1 CAPSULE BY MOUTH EVERY DAY     Gastroenterology: Proton Pump Inhibitors Failed - 10/30/2021 12:55 PM      Failed - Valid encounter within last 12 months    Recent Outpatient Visits           1 year ago Insomnia, unspecified type   [facility] Kim, Adriana M, [name]   2 years ago Insomnia, unspecified type   [place], Wendee Beavers, [place]   3 years ago Prediabetes   [place], [place], [place]   4 years ago Hypertension, unspecified type   [place], [place], [place]       Future Appointments             In 2 weeks [name], [name] [employer], [place]   In 6 months Brendolyn Patty, MD [place]             traZODone (DESYREL) 50 MG tablet 90 tablet 1    Sig: Take 1/2 to 1 tablet (25-50 mg total) by mouth at bedtime as needed for sleep     Psychiatry: Antidepressants - Serotonin Modulator Failed - 10/30/2021 12:55 PM      Failed - Valid encounter within last 6 months    Recent Outpatient Visits           1 year ago Insomnia, unspecified type   [facility] [place], [place], [place]   2 years ago Insomnia,  unspecified type   [facility] [place], Wendee Beavers, [place]   3 years ago Prediabetes   [place], [place], [place]   4 years ago Hypertension, unspecified type   [place], Wendee Beavers, [place]       Future Appointments             In 2 weeks Thedore Mins, Ria Comment, [name] [employer], [place]   In 6 months Brendolyn Patty, MD [place]

## 2021-11-13 NOTE — Progress Notes (Signed)
I,Sha'taria Tyson,acting as a Education administrator for [employer], [name].,have documented all relevant documentation on the behalf of Melanie Kirschner, [name],as directed by  Melanie Kirschner, [name] while in the presence of Melanie Kirschner, [name].   Annual Wellness Visit   Patient: Melanie Rhodes, Female    DOB: 10/08/1950, 71 y.o.   MRN: [id number] Visit Date: 11/14/2021  Today's Provider: [name], [name]   Cc. Awv, cpe  Subjective    Melanie Rhodes is a 71 y.o. female who presents today for her Annual Wellness Visit. She reports consuming a general diet.  The patient reports going walking at least 5 times a week for 45-50 minutes.  She generally feels well. She reports sleeping fairly well. She does not have additional problems to discuss today.   Medications: Outpatient Medications Prior to Visit  Medication Sig   hydrocortisone 2.5 % cream Apply topically 2 (two) times daily as needed (Rash).   omeprazole (PRILOSEC) 20 MG capsule TAKE 1 CAPSULE BY MOUTH EVERY DAY   vitamin B-12 (CYANOCOBALAMIN) 1000 MCG tablet Take 1,000 mcg by mouth daily.   Vitamin D, Cholecalciferol, 25 MCG (1000 UT) CAPS Take by mouth daily.   [DISCONTINUED] traZODone (DESYREL) 50 MG tablet Take 1/2 to 1 tablet (25-50 mg total) by mouth at bedtime as needed for sleep   [DISCONTINUED] cetirizine (ZYRTEC) 10 MG tablet Take 10 mg by mouth daily. (Patient not taking: Reported on 11/14/2021)   [DISCONTINUED] Cholecalciferol (VITAMIN D3) 400 units CAPS Take by mouth daily at 6 (six) AM.   [DISCONTINUED] ketoconazole (NIZORAL) 2 % cream Apply to pink patches on face 1-2 times a day as directed until improved.   No facility-administered medications prior to visit.    No Known Allergies  Patient Care Team: Melanie Kirschner, [name] as PCP - General (Physician Assistant) Brendolyn Patty, MD (Dermatology) System, Provider Not In  Review of Systems  Constitutional: Negative.  Negative for fatigue and fever.  Eyes: Negative.    Respiratory:  Negative for cough and shortness of breath.   Cardiovascular: Negative.  Negative for chest pain and leg swelling.  Gastrointestinal: Negative.  Negative for abdominal pain.  Endocrine: Negative.   Genitourinary: Negative.   Musculoskeletal: Negative.   Skin: Negative.   Allergic/Immunologic: Negative.   Neurological: Negative.  Negative for dizziness and headaches.  Hematological: Negative.   Psychiatric/Behavioral: Negative.         Objective    Blood pressure 134/74, pulse 71, height '5\' 2"'$  (1.575 m), weight 140 lb 9.6 oz (63.8 kg), SpO2 100 %.   Physical Exam Constitutional:      General: She is awake.     Appearance: She is well-developed.  HENT:     Head: Normocephalic.     Right Ear: Tympanic membrane normal.     Left Ear: Tympanic membrane normal.  Eyes:     Conjunctiva/sclera: Conjunctivae normal.     Pupils: Pupils are equal, round, and reactive to light.  Neck:     Thyroid: No thyroid mass or thyromegaly.  Cardiovascular:     Rate and Rhythm: Normal rate and regular rhythm.     Heart sounds: Normal heart sounds.  Pulmonary:     Effort: Pulmonary effort is normal.     Breath sounds: Normal breath sounds.  Abdominal:     Palpations: Abdomen is soft.     Tenderness: There is no abdominal tenderness.  Musculoskeletal:     Right lower leg: No swelling.     Left lower leg: No swelling.  Lymphadenopathy:     Cervical: No cervical adenopathy.  Skin:    General: Skin is warm.  Neurological:     Mental Status: She is alert and oriented to person, place, and time.  Psychiatric:        Attention and Perception: Attention normal.        Mood and Affect: Mood normal.        Speech: Speech normal.        Behavior: Behavior is cooperative.    Most recent functional status assessment: In your present state of health, do you have any difficulty performing the following activities: 11/14/2021  Hearing? N  Vision? Y  Difficulty concentrating or making  decisions? N  Walking or climbing stairs? Y  Dressing or bathing? N  Doing errands, shopping? N  Some recent data might be hidden   Most recent fall risk assessment: Fall Risk  11/14/2021  Falls in the past year? 0  Number falls in past yr: 0  Injury with Fall? 0  Risk for fall due to : No Fall Risks    Most recent depression screenings: PHQ 2/9 Scores 11/14/2021 10/29/2020  PHQ - 2 Score 0 0  PHQ- 9 Score 2 -   Most recent cognitive screening: No flowsheet data found. Most recent Audit-C alcohol use screening Alcohol Use Disorder Test (AUDIT) 11/14/2021  1. How often do you have a drink containing alcohol? 4  2. How many drinks containing alcohol do you have on a typical day when you are drinking? 0  3. How often do you have six or more drinks on one occasion? 0  AUDIT-C Score 4  4. How often during the last year have you found that you were not able to stop drinking once you had started? -  5. How often during the last year have you failed to do what was normally expected from you because of drinking? -  6. How often during the last year have you needed a first drink in the morning to get yourself going after a heavy drinking session? -  7. How often during the last year have you had a feeling of guilt of remorse after drinking? -  8. How often during the last year have you been unable to remember what happened the night before because you had been drinking? -  9. Have you or someone else been injured as a result of your drinking? -  10. Has a relative or friend or a doctor or another health worker been concerned about your drinking or suggested you cut down? -  Alcohol Use Disorder Identification Test Final Score (AUDIT) -  Alcohol Brief Interventions/Follow-up -   A score of 3 or more in women, and 4 or more in men indicates increased risk for alcohol abuse, EXCEPT if all of the points are from question 1   No results found for any visits on 11/14/21.  Assessment & Plan      Annual wellness visit done today including the all of the following: Reviewed patient's Family Medical History Reviewed and updated list of patient's medical providers Assessment of cognitive impairment was done Assessed patient's functional ability Established a written schedule for health screening [place] Completed and Reviewed  Exercise Activities and Dietary recommendations  Goals      DIET - INCREASE WATER INTAKE     Recommend to drink at least 6-8 8oz glasses of water per day.        Immunization History  Administered  Date(s) Administered   Fluad Quad(high Dose 65+) 06/10/2019, 05/15/2020   Influenza, High Dose Seasonal PF 05/27/2018   Influenza-Unspecified 05/27/2018   Meningococcal Conjugate 12/24/2016   PFIZER(Purple Top)SARS-COV-2 Vaccination 11/04/2019, 11/30/2019, 07/03/2020   Pneumococcal Polysaccharide-23 05/27/2018   Zoster, Live 11/01/2015    Health Maintenance  Topic Date Due   Pneumonia Vaccine 36+ Years old (2 - PCV) 05/28/2019   COVID-19 Vaccine (4 - Booster for [place] series) 11/30/2021 (Originally 08/28/2020)   Zoster Vaccines- Shingrix (1 of 2) 02/14/2022 (Originally 05/16/1970)   TETANUS/TDAP  11/15/2022 (Originally 05/16/1970)   MAMMOGRAM  02/12/2022   COLONOSCOPY (Pts 45-93yr Insurance coverage will need to be confirmed)  11/10/2022   DEXA SCAN  11/17/2022   INFLUENZA VACCINE  Completed   Hepatitis C Screening  Completed   HPV VACCINES  Aged Out     Discussed health benefits of physical activity, and encouraged her to engage in regular exercise appropriate for her age and condition.    Problem List Items Addressed This Visit       Other   Insomnia    Currently managed on Trazodone 50 mg, she feels that this occasionally isnt enough, especially when she feels her restless leg syndrome flare. Will sometimes take '100mg'$ .  --Rx 100 mg, to take 1/2 -1 pill nightly.        Relevant Medications   traZODone (DESYREL)  100 MG tablet   Other Relevant Orders   CBC w/Diff/Platelet   TSH + free T4   Restless leg syndrome    Check iron levels. For now managed with trazodone, would like to prevent polypharm as it does not occur that frequently      Relevant Orders   Iron, TIBC and Ferritin Panel   Elevated blood-pressure reading without diagnosis of hypertension    Home readings within range. Continue checking 2-3 times a week. Will monitor in office      Other Visit Diagnoses     Medicare annual wellness visit, subsequent    -  Primary   Relevant Orders   CBC w/Diff/Platelet   Comprehensive Metabolic Panel (CMET)   Lipid panel   HgB A1c   TSH + free T4   Encounter for annual health examination       Relevant Orders   CBC w/Diff/Platelet   Comprehensive Metabolic Panel (CMET)   Lipid panel   HgB A1c   TSH + free T4   Prediabetes       Relevant Orders   HgB A1c   Moderate mixed hyperlipidemia not requiring statin therapy       Relevant Orders   Comprehensive Metabolic Panel (CMET)   Lipid panel        Return in about 6 months (around 05/17/2022) for chronic diseases.     I, LMikey Kirschner [name] have reviewed all documentation for this visit. The documentation on  11/14/2021 for the exam, diagnosis, procedures, and orders are all accurate and complete.  LMikey Kirschner [name] [facility] [place] [address]Office: [phone]: [phone]

## 2021-11-14 ENCOUNTER — Other Ambulatory Visit: Payer: Self-pay | Admitting: Family Medicine

## 2021-11-14 ENCOUNTER — Ambulatory Visit (INDEPENDENT_AMBULATORY_CARE_PROVIDER_SITE_OTHER): Payer: Medicare Other | Admitting: Physician Assistant

## 2021-11-14 ENCOUNTER — Other Ambulatory Visit: Payer: Self-pay

## 2021-11-14 ENCOUNTER — Encounter: Payer: Self-pay | Admitting: Physician Assistant

## 2021-11-14 VITALS — BP 134/74 | HR 71 | Ht 62.0 in | Wt 140.6 lb

## 2021-11-14 DIAGNOSIS — E782 Mixed hyperlipidemia: Secondary | ICD-10-CM

## 2021-11-14 DIAGNOSIS — G4700 Insomnia, unspecified: Secondary | ICD-10-CM | POA: Diagnosis not present

## 2021-11-14 DIAGNOSIS — R030 Elevated blood-pressure reading, without diagnosis of hypertension: Secondary | ICD-10-CM | POA: Insufficient documentation

## 2021-11-14 DIAGNOSIS — R12 Heartburn: Secondary | ICD-10-CM

## 2021-11-14 DIAGNOSIS — Z0000 Encounter for general adult medical examination without abnormal findings: Secondary | ICD-10-CM

## 2021-11-14 DIAGNOSIS — G2581 Restless legs syndrome: Secondary | ICD-10-CM | POA: Diagnosis not present

## 2021-11-14 DIAGNOSIS — R7303 Prediabetes: Secondary | ICD-10-CM | POA: Diagnosis not present

## 2021-11-14 MED ORDER — TRAZODONE HCL 100 MG PO TABS: 50.0000 mg | ORAL_TABLET | Freq: Every day | ORAL | 1 refills | Status: DC

## 2021-11-14 NOTE — Assessment & Plan Note (Signed)
Home readings within range. ?Continue checking 2-3 times a week. ?Will monitor in office ?

## 2021-11-14 NOTE — Assessment & Plan Note (Signed)
Currently managed on Trazodone 50 mg, she feels that this occasionally isnt enough, especially when she feels her restless leg syndrome flare. Will sometimes take '100mg'$ .  ?--Rx 100 mg, to take 1/2 -1 pill nightly.  ? ?

## 2021-11-14 NOTE — Assessment & Plan Note (Signed)
Check iron levels. ?For now managed with trazodone, would like to prevent polypharm as it does not occur that frequently ?

## 2021-11-15 LAB — COMPREHENSIVE METABOLIC PANEL
ALT: 16 IU/L (ref 0–32)
AST: 24 IU/L (ref 0–40)
Albumin/Globulin Ratio: 1.9 (ref 1.2–2.2)
Albumin: 4.7 g/dL (ref 3.8–4.8)
Alkaline Phosphatase: 56 IU/L (ref 44–121)
BUN/Creatinine Ratio: 18 (ref 12–28)
BUN: 14 mg/dL (ref 8–27)
Bilirubin Total: 0.5 mg/dL (ref 0.0–1.2)
CO2: 19 mmol/L — ABNORMAL LOW (ref 20–29)
Calcium: 9.7 mg/dL (ref 8.7–10.3)
Chloride: 101 mmol/L (ref 96–106)
Creatinine, Ser: 0.77 mg/dL (ref 0.57–1.00)
Globulin, Total: 2.5 g/dL (ref 1.5–4.5)
Glucose: 111 mg/dL — ABNORMAL HIGH (ref 70–99)
Potassium: 4.3 mmol/L (ref 3.5–5.2)
Sodium: 141 mmol/L (ref 134–144)
Total Protein: 7.2 g/dL (ref 6.0–8.5)
eGFR: 83 mL/min/{1.73_m2} (ref >59)

## 2021-11-15 LAB — CBC WITH DIFFERENTIAL/PLATELET
Basophils Absolute: 0 10*3/uL (ref 0.0–0.2)
Basos: 1 %
EOS (ABSOLUTE): 0 10*3/uL (ref 0.0–0.4)
Eos: 0 %
Hematocrit: 44 % (ref 34.0–46.6)
Hemoglobin: 14.8 g/dL (ref 11.1–15.9)
Immature Grans (Abs): 0 10*3/uL (ref 0.0–0.1)
Immature Granulocytes: 0 %
Lymphocytes Absolute: 1.1 10*3/uL (ref 0.7–3.1)
Lymphs: 17 %
MCH: 31.4 pg (ref 26.6–33.0)
MCHC: 33.6 g/dL (ref 31.5–35.7)
MCV: 93 fL (ref 79–97)
Monocytes Absolute: 0.6 10*3/uL (ref 0.1–0.9)
Monocytes: 9 %
Neutrophils Absolute: 5 10*3/uL (ref 1.4–7.0)
Neutrophils: 73 %
Platelets: 202 10*3/uL (ref 150–450)
RBC: 4.71 x10E6/uL (ref 3.77–5.28)
RDW: 12.3 % (ref 11.7–15.4)
WBC: 6.7 10*3/uL (ref 3.4–10.8)

## 2021-11-15 LAB — IRON,TIBC AND FERRITIN PANEL
Ferritin: 565 ng/mL — ABNORMAL HIGH (ref 15–150)
Iron Saturation: 34 % (ref 15–55)
Iron: 133 ug/dL (ref 27–139)
Total Iron Binding Capacity: 390 ug/dL (ref 250–450)
UIBC: 257 ug/dL (ref 118–369)

## 2021-11-15 LAB — LIPID PANEL
Chol/HDL Ratio: 3.3 ratio (ref 0.0–4.4)
Cholesterol, Total: 269 mg/dL — ABNORMAL HIGH (ref 100–199)
HDL: 82 mg/dL (ref >39)
LDL Chol Calc (NIH): 171 mg/dL — ABNORMAL HIGH (ref 0–99)
Triglycerides: 96 mg/dL (ref 0–149)
VLDL Cholesterol Cal: 16 mg/dL (ref 5–40)

## 2021-11-15 LAB — TSH+FREE T4
Free T4: 1.43 ng/dL (ref 0.82–1.77)
TSH: 1.03 u[IU]/mL (ref 0.450–4.500)

## 2021-11-15 LAB — HEMOGLOBIN A1C
Est. average glucose Bld gHb Est-mCnc: 120 mg/dL
Hgb A1c MFr Bld: 5.8 % — ABNORMAL HIGH (ref 4.8–5.6)

## 2021-11-15 MED ORDER — OMEPRAZOLE 20 MG PO CPDR: 20.0000 mg | DELAYED_RELEASE_CAPSULE | Freq: Every day | ORAL | 1 refills | Status: DC

## 2021-11-18 ENCOUNTER — Other Ambulatory Visit: Payer: Self-pay | Admitting: Physician Assistant

## 2021-11-18 ENCOUNTER — Encounter: Payer: Self-pay | Admitting: Physician Assistant

## 2021-11-18 DIAGNOSIS — E782 Mixed hyperlipidemia: Secondary | ICD-10-CM

## 2021-11-18 DIAGNOSIS — R7989 Other specified abnormal findings of blood chemistry: Secondary | ICD-10-CM

## 2021-11-18 MED ORDER — ROSUVASTATIN CALCIUM 5 MG PO TABS: 5.0000 mg | ORAL_TABLET | Freq: Every day | ORAL | 1 refills | Status: DC

## 2021-12-04 ENCOUNTER — Other Ambulatory Visit: Payer: Self-pay | Admitting: Physician Assistant

## 2021-12-04 ENCOUNTER — Encounter: Payer: Self-pay | Admitting: Physician Assistant

## 2021-12-04 DIAGNOSIS — E782 Mixed hyperlipidemia: Secondary | ICD-10-CM

## 2021-12-04 MED ORDER — PRAVASTATIN SODIUM 20 MG PO TABS
20.0000 mg | ORAL_TABLET | Freq: Every day | ORAL | 1 refills | Status: DC
Start: 2021-12-04 — End: 2022-05-13

## 2021-12-09 ENCOUNTER — Encounter: Payer: Self-pay | Admitting: Physician Assistant

## 2021-12-18 DIAGNOSIS — R7989 Other specified abnormal findings of blood chemistry: Secondary | ICD-10-CM | POA: Diagnosis not present

## 2021-12-21 LAB — TRANSFERRIN SATURATION
IRON SATN MFR SERPL: 36 % Saturation
IRON SERPL-MCNC: 147 ug/dL — ABNORMAL HIGH
TRANSFERRIN SERPL-MCNC: 292 mg/dL

## 2021-12-21 LAB — FERRITIN: Ferritin: 533 ng/mL — ABNORMAL HIGH (ref 15–150)

## 2021-12-24 ENCOUNTER — Encounter: Payer: Self-pay | Admitting: Physician Assistant

## 2022-01-30 ENCOUNTER — Telehealth: Payer: Self-pay

## 2022-01-30 NOTE — Telephone Encounter (Signed)
Copied from [place] [phone]. Topic: General - Other >> Jan 30, 2022  9:05 AM Melanie Rhodes wrote: Reason for CRM: Pt called in stating she is needing a Diagnostic Mammo Order sent over to the [facility], please advise.

## 2022-02-04 ENCOUNTER — Other Ambulatory Visit: Payer: Self-pay | Admitting: Physician Assistant

## 2022-02-04 DIAGNOSIS — R928 Other abnormal and inconclusive findings on diagnostic imaging of breast: Secondary | ICD-10-CM

## 2022-03-03 ENCOUNTER — Ambulatory Visit
Admission: RE | Admit: 2022-03-03 | Discharge: 2022-03-03 | Disposition: A | Discharge status: Home / Self Care | Payer: Medicare Other | Source: Ambulatory Visit | Attending: Physician Assistant | Admitting: Physician Assistant

## 2022-03-03 DIAGNOSIS — R922 Inconclusive mammogram: Secondary | ICD-10-CM | POA: Diagnosis not present

## 2022-03-03 DIAGNOSIS — R928 Other abnormal and inconclusive findings on diagnostic imaging of breast: Secondary | ICD-10-CM | POA: Diagnosis not present

## 2022-05-05 ENCOUNTER — Ambulatory Visit: Payer: Medicare Other | Admitting: Dermatology

## 2022-05-08 ENCOUNTER — Other Ambulatory Visit: Payer: Self-pay | Admitting: Physician Assistant

## 2022-05-08 DIAGNOSIS — R12 Heartburn: Secondary | ICD-10-CM

## 2022-05-08 DIAGNOSIS — G4700 Insomnia, unspecified: Secondary | ICD-10-CM

## 2022-05-13 ENCOUNTER — Ambulatory Visit (INDEPENDENT_AMBULATORY_CARE_PROVIDER_SITE_OTHER): Payer: Medicare Other | Admitting: Dermatology

## 2022-05-13 ENCOUNTER — Encounter: Payer: Self-pay | Admitting: Dermatology

## 2022-05-13 DIAGNOSIS — L820 Inflamed seborrheic keratosis: Secondary | ICD-10-CM | POA: Diagnosis not present

## 2022-05-13 DIAGNOSIS — I8393 Asymptomatic varicose veins of bilateral lower extremities: Secondary | ICD-10-CM

## 2022-05-13 DIAGNOSIS — D1800 Hemangioma unspecified site: Secondary | ICD-10-CM | POA: Diagnosis not present

## 2022-05-13 DIAGNOSIS — Z1283 Encounter for screening for malignant neoplasm of skin: Secondary | ICD-10-CM

## 2022-05-13 DIAGNOSIS — L918 Other hypertrophic disorders of the skin: Secondary | ICD-10-CM

## 2022-05-13 DIAGNOSIS — L814 Other melanin hyperpigmentation: Secondary | ICD-10-CM

## 2022-05-13 DIAGNOSIS — L578 Other skin changes due to chronic exposure to nonionizing radiation: Secondary | ICD-10-CM

## 2022-05-13 DIAGNOSIS — D2371 Other benign neoplasm of skin of right lower limb, including hip: Secondary | ICD-10-CM

## 2022-05-13 DIAGNOSIS — D229 Melanocytic nevi, unspecified: Secondary | ICD-10-CM

## 2022-05-13 DIAGNOSIS — L209 Atopic dermatitis, unspecified: Secondary | ICD-10-CM | POA: Diagnosis not present

## 2022-05-13 DIAGNOSIS — L603 Nail dystrophy: Secondary | ICD-10-CM

## 2022-05-13 DIAGNOSIS — L719 Rosacea, unspecified: Secondary | ICD-10-CM | POA: Diagnosis not present

## 2022-05-13 DIAGNOSIS — L821 Other seborrheic keratosis: Secondary | ICD-10-CM

## 2022-05-13 DIAGNOSIS — L853 Xerosis cutis: Secondary | ICD-10-CM

## 2022-05-13 MED ORDER — TACROLIMUS 0.1 % EX OINT: TOPICAL_OINTMENT | CUTANEOUS | 3 refills | Status: DC

## 2022-05-13 MED ORDER — METRONIDAZOLE 0.75 % EX CREA: TOPICAL_CREAM | CUTANEOUS | 11 refills | Status: DC

## 2022-05-13 NOTE — Patient Instructions (Addendum)
Rosacea   Start metronidazole cream 0.75 % apply topically to face and nose daily.   What is rosacea? Rosacea (say: ro-zay-sha) is a common skin disease that usually begins as a trend of flushing or blushing easily.  As rosacea progresses, a persistent redness in the center of the face will develop and may gradually spread beyond the nose and cheeks to the forehead and chin.  In some cases, the ears, chest, and back could be affected.  Rosacea may appear as tiny blood vessels or small red bumps that occur in crops.  Frequently they can contain pus, and are called "pustules".  If the bumps do not contain pus, they are referred to as "papules".  Rarely, in prolonged, untreated cases of rosacea, the oil glands of the nose and cheeks may become permanently enlarged.  This is called rhinophyma, and is seen more frequently in men.  Signs and Risks In its beginning stages, rosacea tends to come and go, which makes it difficult to recognize.  It can start as intermittent flushing of the face.  Eventually, blood vessels may become permanently visible.  Pustules and papules can appear, but can be mistaken for adult acne.  People of all races, ages, genders and ethnic groups are at risk of developing rosacea.  However, it is more common in women (especially around menopause) and adults with fair skin between the ages of 13 and 21.  Treatment Dermatologists typically recommend a combination of treatments to effectively manage rosacea.  Treatment can improve symptoms and may stop the progression of the rosacea.  Treatment may involve both topical and oral medications.  The tetracycline antibiotics are often used for their anti-inflammatory effect; however, because of the possibility of developing antibiotic resistance, they should not be used long term at full dose.  For dilated blood vessels the options include electrodessication (uses electric current through a small needle), laser treatment, and cosmetics to hide  the redness.   With all forms of treatment, improvement is a slow process, and patients may not see any results for the first 3-4 weeks.  It is very important to avoid the sun and other triggers.  Patients must wear sunscreen daily.  Skin Care Instructions: Cleanse the skin with a mild soap such as CeraVe cleanser, Cetaphil cleanser, or Dove soap once or twice daily as needed. Moisturize with Eucerin Redness Relief Daily Perfecting Lotion (has a subtle green tint), CeraVe Moisturizing Cream, or Oil of Olay Daily Moisturizer with sunscreen every morning and/or night as recommended. Makeup should be "non-comedogenic" (won't clog pores) and be labeled "for sensitive skin". Good choices for cosmetics are: Neutrogena, Almay, and Physician's Formula.  Any product with a green tint tends to offset a red complexion. If your eyes are dry and irritated, use artificial tears 2-3 times per day and cleanse the eyelids daily with baby shampoo.  Have your eyes examined at least every 2 years.  Be sure to tell your eye doctor that you have rosacea. Alcoholic beverages tend to cause flushing of the skin, and may make rosacea worse. Always wear sunscreen, protect your skin from extreme hot and cold temperatures, and avoid spicy foods, hot drinks, and mechanical irritation such as rubbing, scrubbing, or massaging the face.  Avoid harsh skin cleansers, cleansing masks, astringents, and exfoliation. If a particular product burns or makes your face feel tight, then it is likely to flare your rosacea. If you are having difficulty finding a sunscreen that you can tolerate, you may try switching to a  chemical-free sunscreen.  These are ones whose active ingredient is zinc oxide or titanium dioxide only.  They should also be fragrance free, non-comedogenic, and labeled for sensitive skin. Rosacea triggers may vary from person to person.  There are a variety of foods that have been reported to trigger rosacea.  Some patients find  that keeping a diary of what they were doing when they flared helps them avoid triggers.    For Eczema / Seborrheic Dermatitis   Start Tacrolimus 0.1 % ointment apply topically 1 - 2 times daily to red scaly patches at face and body as needed.      Gentle Skin Care Guide  1. Bathe no more than once a day.  2. Avoid bathing in hot water  3. Use a mild soap like Dove, Vanicream, Cetaphil, CeraVe. Can use Lever 2000 or Cetaphil antibacterial soap  4. Use soap only where you need it. On most days, use it under your arms, between your legs, and on your feet. Let the water rinse other areas unless visibly dirty.  5. When you get out of the bath/shower, use a towel to gently blot your skin dry, don't rub it.  6. While your skin is still a little damp, apply a moisturizing cream such as Vanicream, CeraVe, Cetaphil, Eucerin, Sarna lotion or plain Vaseline Jelly. For hands apply Neutrogena [place] Hand Cream or Excipial Hand Cream.  7. Reapply moisturizer any time you start to itch or feel dry.  8. Sometimes using free and clear laundry detergents can be helpful. Fabric softener sheets should be avoided. Downy Free & Gentle liquid, or any liquid fabric softener that is free of dyes and perfumes, it acceptable to use  9. If your doctor has given you prescription creams you may apply moisturizers over them    Cryotherapy Aftercare  Wash gently with soap and water everyday.   Apply Vaseline and Band-Aid daily until healed.     Seborrheic Keratosis  What causes seborrheic keratoses? Seborrheic keratoses are harmless, common skin growths that first appear during adult life.  As time goes by, more growths appear.  Some people may develop a large number of them.  Seborrheic keratoses appear on both covered and uncovered body parts.  They are not caused by sunlight.  The tendency to develop seborrheic keratoses can be inherited.  They vary in color from skin-colored to gray, brown, or even  black.  They can be either smooth or have a rough, warty surface.   Seborrheic keratoses are superficial and look as if they were stuck on the skin.  Under the microscope this type of keratosis looks like layers upon layers of skin.  That is why at times the top layer may seem to fall off, but the rest of the growth remains and re-grows.    Treatment Seborrheic keratoses do not need to be treated, but can easily be removed in the office.  Seborrheic keratoses often cause symptoms when they rub on clothing or jewelry.  Lesions can be in the way of shaving.  If they become inflamed, they can cause itching, soreness, or burning.  Removal of a seborrheic keratosis can be accomplished by freezing, burning, or surgery. If any spot bleeds, scabs, or grows rapidly, please return to have it checked, as these can be an indication of a skin cancer.    Melanoma ABCDEs  Melanoma is the most dangerous type of skin cancer, and is the leading cause of death from skin disease.  You are more  likely to develop melanoma if you: Have light-colored skin, light-colored eyes, or red or blond hair Spend a lot of time in the sun Tan regularly, either outdoors or in a tanning bed Have had blistering sunburns, especially during childhood Have a close family member who has had a melanoma Have atypical moles or large birthmarks  Early detection of melanoma is key since treatment is typically straightforward and cure rates are extremely high if we catch it early.   The first sign of melanoma is often a change in a mole or a new dark spot.  The ABCDE system is a way of remembering the signs of melanoma.  A for asymmetry:  The two halves do not match. B for border:  The edges of the growth are irregular. C for color:  A mixture of colors are present instead of an even brown color. D for diameter:  Melanomas are usually (but not always) greater than 51m - the size of a pencil eraser. E for evolution:  The spot keeps  changing in size, shape, and color.  Please check your skin once per month between visits. You can use a small mirror in front and a large mirror behind you to keep an eye on the back side or your body.   If you see any new or changing lesions before your next follow-up, please call to schedule a visit.  Please continue daily skin protection including broad spectrum sunscreen SPF 30+ to sun-exposed areas, reapplying every 2 hours as needed when you're outdoors.   Staying in the shade or wearing long sleeves, sun glasses (UVA+UVB protection) and wide brim hats (4-inch brim around the entire circumference of the hat) are also recommended for sun protection.    Due to recent changes in healthcare laws, you may see results of your pathology and/or laboratory studies on MyChart before the doctors have had a chance to review them. We understand that in some cases there may be results that are confusing or concerning to you. Please understand that not all results are received at the same time and often the doctors may need to interpret multiple results in order to provide you with the best plan of care or course of treatment. Therefore, we ask that you please give u[place]2 business days to thoroughly review all your results before contacting the office for clarification. Should we see a critical lab result, you will be contacted sooner.   If You Need Anything After Your Visit  If you have any questions or concerns for your doctor, please call our main line at [phone] press option 4 to reach your doctor's medical assistant. If no one answers, please leave a voicemail as directed and we will return your call as soon as possible. Messages left after 4 pm will be answered the following business day.   You may also send [place] message via [place] We typically respond to MyChart messages within 1-2 business days.  For prescription refills, please ask your pharmacy to contact our office. Our fax number is  [phone]  If you have an urgent issue when the clinic is closed that cannot wait until the next business day, you can page your doctor at the number below.    Please note that while we do our best to be available for urgent issues outside of office hours, we are not available 24/7.   If you have an urgent issue and are unable to reach u[place] you may choose to seek medical care  at your doctor's office, retail clinic, urgent care center, or emergency room.  If you have a medical emergency, please immediately call 911 or go to the emergency department.  Pager Numbers  - Dr. Nehemiah Massed: [phone]  - Dr. Laurence Ferrari: [phone]  - Dr. Nicole Kindred: [phone]  In the event of inclement weather, please call our main line at [phone] for an update on the status of any delays or closures.  Dermatology Medication Tips: Please keep the boxes that topical medications come in in order to help keep track of the instructions about where and how to use these. Pharmacies typically print the medication instructions only on the boxes and not directly on the medication tubes.   If your medication is too expensive, please contact our office at [phone] option 4 or send [place] a message through [place].   We are unable to tell what your co-pay for medications will be in advance as this is different depending on your insurance coverage. However, we may be able to find a substitute medication at lower cost or fill out paperwork to get insurance to cover a needed medication.   If a prior authorization is required to get your medication covered by your insurance company, please allow [place] 1-2 business days to complete this process.  Drug prices often vary depending on where the prescription is filled and some pharmacies may offer cheaper prices.  The website www.goodrx.com contains coupons for medications through different pharmacies. The prices here do not account for what the cost may be with help from  insurance (it may be cheaper with your insurance), but the website can give you the price if you did not use any insurance.  - You can print the associated coupon and take it with your prescription to the pharmacy.  - You may also stop by our office during regular business hours and pick up a GoodRx coupon card.  - If you need your prescription sent electronically to a different pharmacy, notify our office through [facility] or by phone at [phone] option 4.     Si Usted Necesita Algo Despus de Su Visita  Tambin puede enviarnos un mensaje a travs de Pharmacist, community. Por lo general respondemos a los mensajes de MyChart en el transcurso de 1 a 2 das hbiles.  Para renovar recetas, por favor pida a su farmacia que se ponga en contacto con nuestra oficina. Harland Dingwall de fax es [place] [phone].  Si tiene un asunto urgente cuando la clnica est cerrada y que no puede esperar hasta el siguiente da hbil, puede llamar/localizar a su doctor(a) al nmero que aparece a continuacin.   Por favor, tenga en cuenta que aunque hacemos todo lo posible para estar disponibles para asuntos urgentes fuera del horario de [place], no estamos disponibles las 24 horas del da, los 7 das de la [place].   Si tiene un problema urgente y no puede comunicarse con nosotros, puede optar por buscar atencin mdica  en el consultorio de su doctor(a), en una clnica privada, en un centro de atencin urgente o en una sala de emergencias.  Si tiene Engineering geologist, por favor llame inmediatamente al 911 o vaya a la sala de emergencias.  Nmeros de bper  - Dr. Nehemiah Massed: [phone]  - Dra. Moye: [phone]  - Dra. Nicole Kindred: [phone]  En caso de inclemencias del [place], por favor llame a Johnsie Kindred principal al [phone] para una actualizacin sobre el [place] de cualquier retraso o cierre.  Consejos para la medicacin en dermatologa:  Por favor, guarde las cajas en las que vienen los  medicamentos de uso tpico para ayudarle a seguir las instrucciones sobre dnde y cmo usarlos. Las farmacias generalmente imprimen las instrucciones del medicamento slo en las cajas y no directamente en los tubos del [place].   Si su medicamento es muy caro, por favor, pngase en contacto con Zigmund Daniel llamando al [phone] y presione la opcin 4 o envenos un mensaje a travs de Pharmacist, community.   No podemos decirle cul ser su copago por los medicamentos por adelantado ya que esto es diferente dependiendo de la cobertura de su seguro. Sin embargo, es posible que podamos encontrar un medicamento sustituto a Electrical engineer un formulario para que el seguro cubra el medicamento que se considera necesario.   Si se requiere una autorizacin previa para que su compaa de seguros [place] su medicamento, por favor permtanos de 1 a 2 das hbiles para completar este proceso.  Los precios de los medicamentos varan con frecuencia dependiendo del Environmental consultant de dnde se surte la receta y alguna farmacias pueden ofrecer precios ms baratos.  El sitio web www.goodrx.com tiene cupones para medicamentos de Airline pilot. Los precios aqu no tienen en cuenta lo que podra costar con la ayuda del seguro (puede ser ms barato con su seguro), pero el sitio web puede darle el precio si no utiliz Research scientist (physical sciences).  - Puede imprimir el cupn correspondiente y llevarlo con su receta a la farmacia.  - Tambin puede pasar por nuestra oficina durante el horario de atencin regular y Charity fundraiser una tarjeta de cupones de GoodRx.  - Si necesita que su receta se enve electrnicamente a una farmacia diferente, informe a nuestra oficina a travs de MyChart de [place] o por telfono llamando al [phone] y presione la opcin 4.

## 2022-05-13 NOTE — Progress Notes (Signed)
Follow-Up Visit   Subjective  Melanie Rhodes is a 71 y.o. female who presents for the following: Annual Exam (1 yr tbse, hx seb derm , hx of isk, no hx of skin cancer, reports some bumps at nose and dry skin. ).  Has growth behind ear that is irritating  The patient presents for Total-Body Skin Exam (TBSE) for skin cancer screening and mole check.  The patient has spots, moles and lesions to be evaluated, some may be new or changing and the patient has concerns that these could be cancer.   The following portions of the chart were reviewed this encounter and updated as appropriate:      Review of Systems: No other skin or systemic complaints except as noted in HPI or Assessment and Plan.   Objective  Well appearing patient in no apparent distress; mood and affect are within normal limits.  A full examination was performed including scalp, head, eyes, ears, nose, lips, neck, chest, axillae, abdomen, back, buttocks, bilateral upper extremities, bilateral lower extremities, hands, feet, fingers, toes, fingernails, and toenails. All findings within normal limits unless otherwise noted below.  mid face and cheeks Few active papules at nose and erythema with erythema, telangectasia at cheeks  chin, perioral, alar crease, brows, b/l anticubital Pink scaly patch, right and left temple, right eyebrow, glabella and chin b/l antecubital   right thumbnail Vertical ridging   right posterior ear Erythematous stuck-on, waxy papule or plaque   Assessment & Plan  Rosacea mid face and cheeks  Chronic and persistent condition with duration or expected duration over one year. Condition is bothersome/symptomatic for patient. Currently flared.  Rosacea is a chronic progressive skin condition usually affecting the face of adults, causing redness and/or acne bumps. It is treatable but not curable. It sometimes affects the eyes (ocular rosacea) as well. It may respond to topical and/or systemic  medication and can flare with stress, sun exposure, alcohol, exercise and some foods.  Daily application of broad spectrum spf 30+ sunscreen to face is recommended to reduce flares.  Start metronidazole 0.75 % cream - apply topically to face and nose for rosacea qd/bid.   Recommend daily broad spectrum sunscreen SPF 30+ to sun-exposed areas, reapply every 2 hours as needed. Call for new or changing lesions.  Staying in the shade or wearing long sleeves, sun glasses (UVA+UVB protection) and wide brim hats (4-inch brim around the entire circumference of the hat) are also recommended for sun protection.        metroNIDAZOLE (METROCREAM) 0.75 % cream - mid face and cheeks Apply topically to face and nose for rosacea qd  Atopic dermatitis, unspecified type chin, perioral, alar crease, brows, b/l anticubital  Vs seb derm, Chronic and persistent condition with duration or expected duration over one year. Condition is bothersome/symptomatic for patient. Currently flared.   Atopic dermatitis (eczema) is a chronic, relapsing, pruritic condition that can significantly affect quality of life. It is often associated with allergic rhinitis and/or asthma and can require treatment with topical medications, phototherapy, or in severe cases biologic injectable medication (Dupixent; Adbry) or Oral JAK inhibitors.    Start tacrolimus 0.1 % ointment - apply topically qd/bid prn daily to red patches at face and other aa's of body  D/c  hydrocortisone 2.5% cream D/c  ketoconazole 2% cream   tacrolimus (PROTOPIC) 0.1 % ointment - chin, perioral, alar crease, brows, b/l anticubital Apply topically to aa face and body qd/bid prn for red scaly patches  Nail dystrophy right  thumbnail  Mild, due to dryness from frequent hand washing, doubt psoriasis  Recommend mild soap and moisturizer to nails after hand washing  Inflamed seborrheic keratosis right posterior ear  Symptomatic, irritating, patient would  like treated.  Destruction of lesion - right posterior ear  Destruction method: cryotherapy   Informed consent: discussed and consent obtained   Lesion destroyed using liquid nitrogen: Yes   Region frozen until ice ball extended beyond lesion: Yes   Outcome: patient tolerated procedure well with no complications   Post-procedure details: wound care instructions given   Additional details:  Prior to procedure, discussed risks of blister formation, small wound, skin dyspigmentation, or rare scar following cryotherapy. Recommend Vaseline ointment to treated areas while healing.   Lentigines - Scattered tan macules - Due to sun exposure - Benign-appearing, observe - Recommend daily broad spectrum sunscreen SPF 30+ to sun-exposed areas, reapply every 2 hours as needed. - Call for any changes  Seborrheic Keratoses - Stuck-on, waxy, tan-brown papules and/or plaques  - Benign-appearing - Discussed benign etiology and prognosis. - Observe - Call for any changes  Xerosis - diffuse xerotic patches - recommend gentle, hydrating skin care - gentle skin care handout given  Varicose Veins/Spider Veins - Dilated blue, purple or red veins at the lower extremities - Reassured - Smaller vessels can be treated by sclerotherapy (a procedure to inject a medicine into the veins to make them disappear) if desired, but the treatment is not covered by insurance. Larger vessels may be covered if symptomatic and we would refer to vascular surgeon if treatment desired.  Acrochordons (Skin Tags) Inframammary b/l  - Fleshy, skin-colored pedunculated papules - Benign appearing.  - Observe. - If desired, they can be removed with an in office procedure that is not covered by insurance. - Please call the clinic if you notice any new or changing lesions.  Dermatofibroma Right hip x 2 - Firm pink/brown papulenodule with dimple sign - Benign appearing - Call for any changes   Melanocytic Nevi -  Tan-brown and/or pink-flesh-colored symmetric macules and papules - Benign appearing on exam today - Observation - Call clinic for new or changing moles - Recommend daily use of broad spectrum spf 30+ sunscreen to sun-exposed areas.   Hemangiomas - Red papules - Discussed benign nature - Observe - Call for any changes  Actinic Damage - Chronic condition, secondary to cumulative UV/sun exposure - diffuse scaly erythematous macules with underlying dyspigmentation - Recommend daily broad spectrum sunscreen SPF 30+ to sun-exposed areas, reapply every 2 hours as needed.  - Staying in the shade or wearing long sleeves, sun glasses (UVA+UVB protection) and wide brim hats (4-inch brim around the entire circumference of the hat) are also recommended for sun protection.  - Call for new or changing lesions.  Skin cancer screening performed today. Return in about 1 year (around 05/14/2023) for TBSE. I, Ruthell Rummage, CMA, am acting as scribe for Brendolyn Patty, MD.  Documentation: I have reviewed the above documentation for accuracy and completeness, and I agree with the above.  Brendolyn Patty MD

## 2022-05-15 ENCOUNTER — Other Ambulatory Visit: Payer: Self-pay

## 2022-05-15 DIAGNOSIS — L209 Atopic dermatitis, unspecified: Secondary | ICD-10-CM

## 2022-05-15 DIAGNOSIS — L719 Rosacea, unspecified: Secondary | ICD-10-CM

## 2022-05-15 MED ORDER — TACROLIMUS 0.1 % EX OINT: TOPICAL_OINTMENT | CUTANEOUS | 3 refills | Status: DC

## 2022-05-15 MED ORDER — METRONIDAZOLE 0.75 % EX CREA: TOPICAL_CREAM | CUTANEOUS | 11 refills | Status: DC

## 2022-05-19 ENCOUNTER — Ambulatory Visit (INDEPENDENT_AMBULATORY_CARE_PROVIDER_SITE_OTHER): Payer: Medicare Other | Admitting: Physician Assistant

## 2022-05-19 ENCOUNTER — Encounter: Payer: Self-pay | Admitting: Physician Assistant

## 2022-05-19 VITALS — BP 128/78 | HR 73 | Ht 62.0 in | Wt 144.0 lb

## 2022-05-19 DIAGNOSIS — E782 Mixed hyperlipidemia: Secondary | ICD-10-CM | POA: Diagnosis not present

## 2022-05-19 DIAGNOSIS — M797 Fibromyalgia: Secondary | ICD-10-CM | POA: Insufficient documentation

## 2022-05-19 DIAGNOSIS — R7989 Other specified abnormal findings of blood chemistry: Secondary | ICD-10-CM | POA: Diagnosis not present

## 2022-05-19 DIAGNOSIS — Z23 Encounter for immunization: Secondary | ICD-10-CM

## 2022-05-19 DIAGNOSIS — R030 Elevated blood-pressure reading, without diagnosis of hypertension: Secondary | ICD-10-CM

## 2022-05-19 NOTE — Assessment & Plan Note (Signed)
Elevated in office wnr at home

## 2022-05-19 NOTE — Assessment & Plan Note (Signed)
Discussed potentially restarting crestor 5 mg, maybe every other day if LDL is still elevated Will check fasting lipids today The 10-year ASCVD risk score (Arnett DK, et al., 2019) is: 10.7%

## 2022-05-19 NOTE — Assessment & Plan Note (Signed)
Discussed 2/2 metabolic syndrome, fibromyalgia  But will monitor

## 2022-05-19 NOTE — Progress Notes (Signed)
I,Sha'taria Tyson,acting as a Education administrator for [employer], [name].,have documented all relevant documentation on the behalf of [name], [name],as directed by  [name], [name] while in the presence of [name], [name].   Established patient visit   Patient: Melanie Rhodes   DOB: [birth year]   71 y.o. Female  MRN: [id number] Visit Date: 05/19/2022  Today's healthcare provider: [name], [name]   Cc. Htn, hld f/u  Subjective    HPI  Hypertension, follow-up  BP Readings from Last 3 Encounters:  05/19/22 128/78  11/14/21 134/74  05/15/20 (!) 151/93   Wt Readings from Last 3 Encounters:  05/19/22 144 lb (65.3 kg)  11/14/21 140 lb 9.6 oz (63.8 kg)  05/15/20 139 lb (63 kg)     She was last seen for hypertension 6 months ago.  BP at that visit was 134/73. Management since that visit includes Continue checking 2-3 times a week.  She is following a Regular diet. She is exercising. She does not smoke.  Use of agents associated with hypertension: none.   Outside blood pressures are 109/81-141/84 Symptoms: No chest pain No chest pressure  No palpitations No syncope  No dyspnea No orthopnea  No paroxysmal nocturnal dyspnea No lower extremity edema   Pertinent labs Lab Results  Component Value Date   CHOL 269 (H) 11/14/2021   HDL 82 11/14/2021   LDLCALC 171 (H) 11/14/2021   TRIG 96 11/14/2021   CHOLHDL 3.3 11/14/2021   Lab Results  Component Value Date   NA 141 11/14/2021   K 4.3 11/14/2021   CREATININE 0.77 11/14/2021   EGFR 83 11/14/2021   GLUCOSE 111 (H) 11/14/2021   TSH 1.030 11/14/2021     The 10-year ASCVD risk score (Arnett DK, et al., 2019) is: 10.7%  --------------------------------------------------------------------------------------------------- Follow up for insomnia  The patient was last seen for this 6 months ago. Changes made at last visit include Rx 100 mg, to take 1/2 -1 pill nightly.  She reports excellent compliance with  treatment. She feels that condition is Unchanged. She is not having side effects.   -----------------------------------------------------------------------------------------   Medications: Outpatient Medications Prior to Visit  Medication Sig   metroNIDAZOLE (METROCREAM) 0.75 % cream Apply topically to face and nose for rosacea qd   omeprazole (PRILOSEC) 20 MG capsule TAKE 1 CAPSULE BY MOUTH EVERY DAY   tacrolimus (PROTOPIC) 0.1 % ointment Apply topically to aa face and body qd/bid prn for red scaly patches   traZODone (DESYREL) 100 MG tablet TAKE 0.5-1 TABLETS (50-100 MG TOTAL) BY MOUTH AT BEDTIME.   vitamin B-12 (CYANOCOBALAMIN) 1000 MCG tablet Take 1,000 mcg by mouth daily.   Vitamin D, Cholecalciferol, 25 MCG (1000 UT) CAPS Take by mouth daily.   No facility-administered medications prior to visit.    Review of Systems  Constitutional:  Negative for fatigue and fever.  Respiratory:  Negative for cough and shortness of breath.   Cardiovascular:  Negative for chest pain and leg swelling.  Gastrointestinal:  Negative for abdominal pain.  Neurological:  Negative for dizziness and headaches.       Objective    Blood pressure 128/78, pulse 73, height '5\' 2"'  (1.575 m), weight 144 lb (65.3 kg), SpO2 98 %.   Physical Exam Constitutional:      General: She is awake.     Appearance: She is well-developed.  HENT:     Head: Normocephalic.  Eyes:     Conjunctiva/sclera: Conjunctivae normal.  Cardiovascular:     Rate  and Rhythm: Normal rate and regular rhythm.     Heart sounds: Normal heart sounds.  Pulmonary:     Effort: Pulmonary effort is normal.     Breath sounds: Normal breath sounds.  Skin:    General: Skin is warm.  Neurological:     Mental Status: She is alert and oriented to person, place, and time.  Psychiatric:        Attention and Perception: Attention normal.        Mood and Affect: Mood normal.        Speech: Speech normal.        Behavior: Behavior is  cooperative.      No results found for any visits on 05/19/22.  Assessment & Plan     Problem List Items Addressed This Visit       Other   Elevated blood-pressure reading without diagnosis of hypertension    Elevated in office wnr at home      Relevant Orders   Comprehensive Metabolic Panel (CMET)   Mixed hyperlipidemia - Primary    Discussed potentially restarting crestor 5 mg, maybe every other day if LDL is still elevated Will check fasting lipids today The 10-year ASCVD risk score (Arnett DK, et al., 2019) is: 10.7%       Relevant Orders   Lipid Profile   Comprehensive Metabolic Panel (CMET)   High serum ferritin    Discussed 2/2 metabolic syndrome, fibromyalgia  But will monitor       Relevant Orders   Iron, TIBC and Ferritin Panel   Other Visit Diagnoses     Flu vaccine need       Relevant Orders   Flu Vaccine QUAD High Dose(Fluad) (Completed)        Return in about 6 months (around 11/17/2022) for AVW, CPE.      I, [name], [name] have reviewed all documentation for this visit. The documentation on  05/19/2022 for the exam, diagnosis, procedures, and orders are all accurate and complete.  [name], [name] [facility] [address] [place], [place], [address] Office: [phone] Fax: [place]

## 2022-05-20 LAB — COMPREHENSIVE METABOLIC PANEL
ALT: 18 IU/L (ref 0–32)
AST: 17 IU/L (ref 0–40)
Albumin/Globulin Ratio: 2 (ref 1.2–2.2)
Albumin: 4.9 g/dL — ABNORMAL HIGH (ref 3.8–4.8)
Alkaline Phosphatase: 63 IU/L (ref 44–121)
BUN/Creatinine Ratio: 21 (ref 12–28)
BUN: 15 mg/dL (ref 8–27)
Bilirubin Total: 0.4 mg/dL (ref 0.0–1.2)
CO2: 23 mmol/L (ref 20–29)
Calcium: 10 mg/dL (ref 8.7–10.3)
Chloride: 100 mmol/L (ref 96–106)
Creatinine, Ser: 0.7 mg/dL (ref 0.57–1.00)
Globulin, Total: 2.5 g/dL (ref 1.5–4.5)
Glucose: 110 mg/dL — ABNORMAL HIGH (ref 70–99)
Potassium: 4.2 mmol/L (ref 3.5–5.2)
Sodium: 141 mmol/L (ref 134–144)
Total Protein: 7.4 g/dL (ref 6.0–8.5)
eGFR: 92 mL/min/{1.73_m2} (ref >59)

## 2022-05-20 LAB — IRON,TIBC AND FERRITIN PANEL
Ferritin: 509 ng/mL — ABNORMAL HIGH (ref 15–150)
Iron Saturation: 30 % (ref 15–55)
Iron: 118 ug/dL (ref 27–139)
Total Iron Binding Capacity: 395 ug/dL (ref 250–450)
UIBC: 277 ug/dL (ref 118–369)

## 2022-05-20 LAB — LIPID PANEL
Chol/HDL Ratio: 3.3 ratio (ref 0.0–4.4)
Cholesterol, Total: 271 mg/dL — ABNORMAL HIGH (ref 100–199)
HDL: 82 mg/dL (ref >39)
LDL Chol Calc (NIH): 174 mg/dL — ABNORMAL HIGH (ref 0–99)
Triglycerides: 90 mg/dL (ref 0–149)
VLDL Cholesterol Cal: 15 mg/dL (ref 5–40)

## 2022-08-08 ENCOUNTER — Other Ambulatory Visit: Payer: Self-pay | Admitting: Physician Assistant

## 2022-08-08 ENCOUNTER — Encounter: Payer: Self-pay | Admitting: Physician Assistant

## 2022-08-08 DIAGNOSIS — E782 Mixed hyperlipidemia: Secondary | ICD-10-CM

## 2022-08-08 MED ORDER — ROSUVASTATIN CALCIUM 5 MG PO TABS: 5.0000 mg | ORAL_TABLET | Freq: Every day | ORAL | 3 refills | Status: DC

## 2022-08-19 DIAGNOSIS — H04123 Dry eye syndrome of bilateral lacrimal glands: Secondary | ICD-10-CM | POA: Diagnosis not present

## 2022-08-19 DIAGNOSIS — H524 Presbyopia: Secondary | ICD-10-CM | POA: Diagnosis not present

## 2022-08-19 DIAGNOSIS — H2513 Age-related nuclear cataract, bilateral: Secondary | ICD-10-CM | POA: Diagnosis not present

## 2022-08-19 DIAGNOSIS — H43393 Other vitreous opacities, bilateral: Secondary | ICD-10-CM | POA: Diagnosis not present

## 2022-10-24 ENCOUNTER — Other Ambulatory Visit: Payer: Self-pay | Admitting: Physician Assistant

## 2022-10-24 DIAGNOSIS — G4700 Insomnia, unspecified: Secondary | ICD-10-CM

## 2022-10-24 DIAGNOSIS — R12 Heartburn: Secondary | ICD-10-CM

## 2022-10-27 ENCOUNTER — Telehealth: Payer: Self-pay | Admitting: Physician Assistant

## 2022-10-27 NOTE — Telephone Encounter (Signed)
Contacted Melanie Rhodes to schedule their annual wellness visit. Appointment made for 11/19/2022.  [place] Direct Dial: [phone]

## 2022-11-17 ENCOUNTER — Ambulatory Visit: Payer: Medicare Other | Admitting: Physician Assistant

## 2022-11-18 ENCOUNTER — Encounter: Payer: Self-pay | Admitting: Physician Assistant

## 2022-11-18 ENCOUNTER — Ambulatory Visit (INDEPENDENT_AMBULATORY_CARE_PROVIDER_SITE_OTHER): Payer: Medicare Other | Admitting: Physician Assistant

## 2022-11-18 VITALS — BP 128/78 | HR 70 | Ht 62.0 in | Wt 141.2 lb

## 2022-11-18 DIAGNOSIS — Z780 Asymptomatic menopausal state: Secondary | ICD-10-CM

## 2022-11-18 DIAGNOSIS — Z13820 Encounter for screening for osteoporosis: Secondary | ICD-10-CM | POA: Diagnosis not present

## 2022-11-18 DIAGNOSIS — E782 Mixed hyperlipidemia: Secondary | ICD-10-CM

## 2022-11-18 DIAGNOSIS — Z1211 Encounter for screening for malignant neoplasm of colon: Secondary | ICD-10-CM

## 2022-11-18 DIAGNOSIS — M549 Dorsalgia, unspecified: Secondary | ICD-10-CM | POA: Diagnosis not present

## 2022-11-18 DIAGNOSIS — Z0000 Encounter for general adult medical examination without abnormal findings: Secondary | ICD-10-CM

## 2022-11-18 DIAGNOSIS — R7989 Other specified abnormal findings of blood chemistry: Secondary | ICD-10-CM

## 2022-11-18 DIAGNOSIS — M8580 Other specified disorders of bone density and structure, unspecified site: Secondary | ICD-10-CM

## 2022-11-18 DIAGNOSIS — R030 Elevated blood-pressure reading, without diagnosis of hypertension: Secondary | ICD-10-CM

## 2022-11-18 NOTE — Progress Notes (Signed)
I,Sha'taria Tyson,acting as a Education administrator for [employer], [name].,have documented all relevant documentation on the behalf of Melanie Kirschner, [name],as directed by  Melanie Kirschner, [name] while in the presence of Melanie Kirschner, [name].   Annual Wellness Visit     Patient: Melanie Rhodes, Female    DOB: 1951-06-21, 72 y.o.   MRN: [id number] Visit Date: 11/18/2022  Today's Provider: [name], [name]   Cc. Awv   Subjective    Melanie Rhodes is a 72 y.o. female who presents today for her Annual Wellness Visit. She reports consuming a general diet.  The patient reports walking at least 5 days a week for two to three miles.  She generally feels fairly well. She reports sleeping well. She does not have additional problems to discuss today.   HPI She reports intermittent back pain, sometimes upper thoracic, sometimes lumbar spine. Denies persistence in any one area. Sometimes gets right sided paresthesias with pain.  Medications: Outpatient Medications Prior to Visit  Medication Sig   metroNIDAZOLE (METROCREAM) 0.75 % cream Apply topically to face and nose for rosacea qd   omeprazole (PRILOSEC) 20 MG capsule TAKE 1 CAPSULE BY MOUTH EVERY DAY   rosuvastatin (CRESTOR) 5 MG tablet Take 1 tablet (5 mg total) by mouth daily.   tacrolimus (PROTOPIC) 0.1 % ointment Apply topically to aa face and body qd/bid prn for red scaly patches   traZODone (DESYREL) 100 MG tablet TAKE 0.5-1 TABLETS (50-100 MG TOTAL) BY MOUTH AT BEDTIME.   vitamin B-12 (CYANOCOBALAMIN) 1000 MCG tablet Take 1,000 mcg by mouth daily.   Vitamin D, Cholecalciferol, 25 MCG (1000 UT) CAPS Take by mouth daily.   No facility-administered medications prior to visit.    Allergies  Allergen Reactions   Crestor [Rosuvastatin Calcium] Other (See Comments)    Headaches, GI upset    Patient Care Team: Melanie Kirschner, [name] as PCP - General (Physician Assistant) Brendolyn Patty, MD (Dermatology) System, Provider Not In  Review of  Systems  Constitutional:  Negative for fatigue and fever.  Respiratory:  Negative for cough and shortness of breath.   Cardiovascular:  Negative for chest pain and leg swelling.  Gastrointestinal:  Negative for abdominal pain.  Musculoskeletal:  Positive for back pain.  Neurological:  Negative for dizziness and headaches.      Objective    Vitals: BP 128/78 Comment: home value 11/16/22  Pulse 70   Ht '5\' 2"'$  (1.575 m)   Wt 141 lb 3.2 oz (64 kg)   SpO2 100%   BMI 25.83 kg/m    Physical Exam Constitutional:      General: She is awake.     Appearance: She is well-developed. She is not ill-appearing.  HENT:     Head: Normocephalic.     Right Ear: Tympanic membrane normal.     Left Ear: Tympanic membrane normal.     Nose: Nose normal. No congestion or rhinorrhea.     Mouth/Throat:     Pharynx: No oropharyngeal exudate or posterior oropharyngeal erythema.  Eyes:     Conjunctiva/sclera: Conjunctivae normal.     Pupils: Pupils are equal, round, and reactive to light.  Neck:     Thyroid: No thyroid mass or thyromegaly.  Cardiovascular:     Rate and Rhythm: Normal rate and regular rhythm.     Heart sounds: Normal heart sounds.  Pulmonary:     Effort: Pulmonary effort is normal.     Breath sounds: Normal breath sounds.  Abdominal:     Palpations:  Abdomen is soft.     Tenderness: There is no abdominal tenderness.  Musculoskeletal:     Right lower leg: No swelling. No edema.     Left lower leg: No swelling. No edema.  Lymphadenopathy:     Cervical: No cervical adenopathy.  Skin:    General: Skin is warm.  Neurological:     Mental Status: She is alert and oriented to person, place, and time.  Psychiatric:        Attention and Perception: Attention normal.        Mood and Affect: Mood normal.        Speech: Speech normal.        Behavior: Behavior normal. Behavior is cooperative.    Most recent functional status assessment:    11/18/2022   11:07 AM  In your present state  of health, do you have any difficulty performing the following activities:  Hearing? 0  Vision? 0  Difficulty concentrating or making decisions? 1  Walking or climbing stairs? 0  Dressing or bathing? 0  Doing errands, shopping? 0   Most recent fall risk assessment:    11/18/2022   11:07 AM  Fall Risk   Falls in the past year? 0  Number falls in past yr: 0  Injury with Fall? 0  Risk for fall due to : No Fall Risks  Follow up Falls evaluation completed    Most recent depression screenings:    11/18/2022   11:07 AM 05/19/2022   10:54 AM  PHQ 2/9 Scores  PHQ - 2 Score 0 1  PHQ- 9 Score 1 3   Most recent cognitive screening:     No data to display         Most recent Audit-C alcohol use screening    11/18/2022   11:07 AM  Alcohol Use Disorder Test (AUDIT)  1. How often do you have a drink containing alcohol? 4  2. How many drinks containing alcohol do you have on a typical day when you are drinking? 0  3. How often do you have six or more drinks on one occasion? 0  AUDIT-C Score 4   A score of 3 or more in women, and 4 or more in men indicates increased risk for alcohol abuse, EXCEPT if all of the points are from question 1   No results found for any visits on 11/18/22.  Assessment & Plan     Annual wellness visit done today including the all of the following: Reviewed patient's Family Medical History Reviewed and updated list of patient's medical providers Assessment of cognitive impairment was done Assessed patient's functional ability Established a written schedule for health screening [place] Completed and Reviewed  Exercise Activities and Dietary recommendations --balanced diet high in fiber and protein, low in sugars, carbs, fats. --physical activity/exercise 30 minutes 3-5 times a week     Immunization History  Administered Date(s) Administered   Fluad Quad(high Dose 65+) 06/10/2019, 05/15/2020, 05/19/2022   Influenza Nasal  06/24/2011   Influenza, High Dose Seasonal PF 05/27/2018   Influenza-Unspecified 07/12/2012, 07/10/2014, 05/27/2018   Meningococcal Conjugate 12/24/2016   PFIZER(Purple Top)SARS-COV-2 Vaccination 11/04/2019, 11/30/2019, 07/03/2020   Pneumococcal Polysaccharide-23 05/27/2018   Zoster, Live 11/01/2015    Health Maintenance  Topic Date Due   DTaP/Tdap/Td (1 - Tdap) Never done   Zoster Vaccines- Shingrix (1 of 2) Never done   Pneumonia Vaccine 22+ Years old (2 of 2 - PCV) 05/28/2019   COVID-19 Vaccine (4 -  2023-24 season) 05/09/2022   COLONOSCOPY (Pts 45-64yr Insurance coverage will need to be confirmed)  11/10/2022   DEXA SCAN  11/17/2022   MAMMOGRAM  03/04/2023   Medicare Annual Wellness (AWV)  11/18/2023   INFLUENZA VACCINE  Completed   Hepatitis C Screening  Completed   HPV VACCINES  Aged Out     Discussed health benefits of physical activity, and encouraged her to engage in regular exercise appropriate for her age and condition.    Problem List Items Addressed This Visit       Other   Elevated blood-pressure reading without diagnosis of hypertension    Significantly elevated in office with a large difference from home values Extensive home values all in normal range Encouraged pt to bring in her bp cuff to compare -- her husband will bring in during his next appt.  Advised if there is a large difference, to get a new cuff and check. Any home values >130/90 to return to office Ordered cmp      Mixed hyperlipidemia    Managed on crestor 5 Will repeat lipid panel fasting cmp        Relevant Orders   Lipid Profile   Comprehensive Metabolic Panel (CMET)   High serum ferritin    Will repeat level      Relevant Orders   Iron, TIBC and Ferritin Panel   CBC w/Diff/Platelet   Acute back pain    May be related to fibromyalgia or other back pain  No worrisome signs      Other Visit Diagnoses     Medicare annual wellness visit, subsequent    -  Primary    Osteopenia, unspecified location       Relevant Orders   DG Bone Density   Colon cancer screening       Relevant Orders   Ambulatory referral to Gastroenterology   Encounter for osteoporosis screening in asymptomatic postmenopausal patient       Relevant Orders   DG Bone Density        Return in about 6 months (around 05/21/2023) for chronic conditions.     I, LMikey Kirschner [name] have reviewed all documentation for this visit. The documentation on 11/18/22  for the exam, diagnosis, procedures, and orders are all accurate and complete.  LMikey Kirschner [name] [facility] [place] [id number]: [phone]: [address]

## 2022-11-18 NOTE — Assessment & Plan Note (Signed)
Managed on crestor 5 Will repeat lipid panel fasting cmp

## 2022-11-18 NOTE — Assessment & Plan Note (Signed)
Will repeat level

## 2022-11-18 NOTE — Assessment & Plan Note (Signed)
May be related to fibromyalgia or other back pain  No worrisome signs

## 2022-11-18 NOTE — Assessment & Plan Note (Signed)
Significantly elevated in office with a large difference from home values Extensive home values all in normal range Encouraged pt to bring in her bp cuff to compare -- her husband will bring in during his next appt.  Advised if there is a large difference, to get a new cuff and check. Any home values >130/90 to return to office Ordered cmp

## 2022-11-19 ENCOUNTER — Other Ambulatory Visit: Payer: Self-pay | Admitting: Physician Assistant

## 2022-11-19 ENCOUNTER — Telehealth: Payer: Self-pay

## 2022-11-19 DIAGNOSIS — R7989 Other specified abnormal findings of blood chemistry: Secondary | ICD-10-CM

## 2022-11-19 LAB — LIPID PANEL
Chol/HDL Ratio: 2.4 ratio (ref 0.0–4.4)
Cholesterol, Total: 206 mg/dL — ABNORMAL HIGH (ref 100–199)
HDL: 85 mg/dL (ref >39)
LDL Chol Calc (NIH): 103 mg/dL — ABNORMAL HIGH (ref 0–99)
Triglycerides: 104 mg/dL (ref 0–149)
VLDL Cholesterol Cal: 18 mg/dL (ref 5–40)

## 2022-11-19 LAB — COMPREHENSIVE METABOLIC PANEL
ALT: 30 IU/L (ref 0–32)
AST: 29 IU/L (ref 0–40)
Albumin/Globulin Ratio: 2.2 (ref 1.2–2.2)
Albumin: 4.9 g/dL — ABNORMAL HIGH (ref 3.8–4.8)
Alkaline Phosphatase: 67 IU/L (ref 44–121)
BUN/Creatinine Ratio: 17 (ref 12–28)
BUN: 13 mg/dL (ref 8–27)
Bilirubin Total: 0.7 mg/dL (ref 0.0–1.2)
CO2: 23 mmol/L (ref 20–29)
Calcium: 10.3 mg/dL (ref 8.7–10.3)
Chloride: 99 mmol/L (ref 96–106)
Creatinine, Ser: 0.77 mg/dL (ref 0.57–1.00)
Globulin, Total: 2.2 g/dL (ref 1.5–4.5)
Glucose: 109 mg/dL — ABNORMAL HIGH (ref 70–99)
Potassium: 4.5 mmol/L (ref 3.5–5.2)
Sodium: 138 mmol/L (ref 134–144)
Total Protein: 7.1 g/dL (ref 6.0–8.5)
eGFR: 82 mL/min/{1.73_m2} (ref >59)

## 2022-11-19 LAB — IRON,TIBC AND FERRITIN PANEL
Ferritin: 639 ng/mL — ABNORMAL HIGH (ref 15–150)
Iron Saturation: 42 % (ref 15–55)
Iron: 164 ug/dL — ABNORMAL HIGH (ref 27–139)
Total Iron Binding Capacity: 387 ug/dL (ref 250–450)
UIBC: 223 ug/dL (ref 118–369)

## 2022-11-19 LAB — CBC WITH DIFFERENTIAL/PLATELET
Basophils Absolute: 0.1 10*3/uL (ref 0.0–0.2)
Basos: 1 %
EOS (ABSOLUTE): 0.1 10*3/uL (ref 0.0–0.4)
Eos: 1 %
Hematocrit: 47.2 % — ABNORMAL HIGH (ref 34.0–46.6)
Hemoglobin: 15.5 g/dL (ref 11.1–15.9)
Immature Grans (Abs): 0 10*3/uL (ref 0.0–0.1)
Immature Granulocytes: 0 %
Lymphocytes Absolute: 1.1 10*3/uL (ref 0.7–3.1)
Lymphs: 21 %
MCH: 31.1 pg (ref 26.6–33.0)
MCHC: 32.8 g/dL (ref 31.5–35.7)
MCV: 95 fL (ref 79–97)
Monocytes Absolute: 0.5 10*3/uL (ref 0.1–0.9)
Monocytes: 10 %
Neutrophils Absolute: 3.4 10*3/uL (ref 1.4–7.0)
Neutrophils: 67 %
Platelets: 201 10*3/uL (ref 150–450)
RBC: 4.98 x10E6/uL (ref 3.77–5.28)
RDW: 12 % (ref 11.7–15.4)
WBC: 5.2 10*3/uL (ref 3.4–10.8)

## 2022-11-19 NOTE — Telephone Encounter (Signed)
Patient unable to schedule colonoscopy at this time.  She is at [facility] with a family emergency.  She will call back but unsure when it will be.  Reminder letter mailed to patient.  Last colonoscopy performed by Dr. Vicente Males March 2019. She was due on 11/10/22  Thanks Sharyn Lull, CMA

## 2022-12-01 ENCOUNTER — Inpatient Hospital Stay: Payer: Medicare Other

## 2022-12-01 ENCOUNTER — Encounter: Payer: Self-pay | Admitting: Oncology

## 2022-12-01 ENCOUNTER — Inpatient Hospital Stay: Payer: Medicare Other | Attending: Oncology | Admitting: Oncology

## 2022-12-01 VITALS — BP 186/99 | HR 76 | Temp 96.3°F | Resp 18 | Wt 142.2 lb

## 2022-12-01 DIAGNOSIS — R5383 Other fatigue: Secondary | ICD-10-CM

## 2022-12-01 DIAGNOSIS — R7989 Other specified abnormal findings of blood chemistry: Secondary | ICD-10-CM | POA: Insufficient documentation

## 2022-12-01 DIAGNOSIS — Z7289 Other problems related to lifestyle: Secondary | ICD-10-CM | POA: Diagnosis not present

## 2022-12-01 DIAGNOSIS — Z8249 Family history of ischemic heart disease and other diseases of the circulatory system: Secondary | ICD-10-CM | POA: Diagnosis not present

## 2022-12-01 DIAGNOSIS — Z8349 Family history of other endocrine, nutritional and metabolic diseases: Secondary | ICD-10-CM | POA: Diagnosis not present

## 2022-12-01 DIAGNOSIS — F1090 Alcohol use, unspecified, uncomplicated: Secondary | ICD-10-CM | POA: Insufficient documentation

## 2022-12-01 DIAGNOSIS — Z8379 Family history of other diseases of the digestive system: Secondary | ICD-10-CM | POA: Diagnosis not present

## 2022-12-01 DIAGNOSIS — Z833 Family history of diabetes mellitus: Secondary | ICD-10-CM | POA: Diagnosis not present

## 2022-12-01 DIAGNOSIS — Z789 Other specified health status: Secondary | ICD-10-CM | POA: Insufficient documentation

## 2022-12-01 DIAGNOSIS — Z79899 Other long term (current) drug therapy: Secondary | ICD-10-CM | POA: Diagnosis not present

## 2022-12-01 HISTORY — DX: Alcohol use, unspecified, uncomplicated: F10.90

## 2022-12-01 LAB — HEPATITIS PANEL, ACUTE
HCV Ab: NONREACTIVE
Hep A IgM: NONREACTIVE
Hep B C IgM: NONREACTIVE
Hepatitis B Surface Ag: NONREACTIVE

## 2022-12-01 NOTE — Assessment & Plan Note (Signed)
>>  ASSESSMENT AND PLAN FOR ELEVATED FERRITIN WRITTEN ON 12/01/2022  7:49 PM BY Rickard Patience, MD  Discussed with the patient that elevated ferritin could be secondary to irregular condition versus reactive etiologies. Check hemochromatosis gene DNA PCR.  Check hepatitis panel.  \

## 2022-12-01 NOTE — Progress Notes (Signed)
Hematology/Oncology Consult Note [phone] [phone]    REFERRING PROVIDER: [name], [name]  CHIEF COMPLAINTS/REASON FOR VISIT:  Evaluation of elevated ferritin level.  ASSESSMENT & PLAN:   Elevated ferritin Discussed with the patient that elevated ferritin could be secondary to irregular condition versus reactive etiologies. Check hemochromatosis gene DNA PCR.  Check hepatitis panel.  \  Alcohol use Recommend patient to stop alcohol use.  I will obtain right upper quadrant to liver for further evaluation.   Orders Placed This Encounter  Procedures   US ABDOMEN LIMITED RUQ (LIVER/GB)    Standing Status:   Future    Standing Expiration Date:   12/01/2023    Order Specific Question:   Reason for Exam (SYMPTOM  OR DIAGNOSIS REQUIRED)    Answer:   high serum ferritin    Order Specific Question:   Preferred imaging location?    Answer:   [facility]   Hemochromatosis DNA-PCR(c282y,h63d)    Standing Status:   Future    Number of Occurrences:   1    Standing Expiration Date:   12/01/2023   Hepatitis panel, acute    Standing Status:   Future    Number of Occurrences:   1    Standing Expiration Date:   12/01/2023   Patient will follow-up 3 to 4 weeks to review results. All questions were answered. The patient knows to call the clinic with any problems, questions or concerns.  Earlie Server, MD, PhD [facility] Hematology Oncology 12/01/2022     HISTORY OF PRESENTING ILLNESS:  Melanie Rhodes is a 72 y.o. female who was seen in consultation at the request of Thedore Mins, Ria Comment, [name] for evaluation of elevated ferritin level Patient was noticed to have elevated ferritin since 11/14/2021, ferritin 565. Ferritin persistently elevated on subsequent follow-up labs. 11/18/2022, ferritin 639, iron saturation 42, TIBC 287.  Patient has normal hemoglobin of 15.5, hematocrit 47.2, normal bilirubin 0.7, normal AST/ALT/alkaline phosphatase.  Context History of  previous blood transfusion: denies Shortness of breath: denies Join pain: denies  She drinks alcohol about 5 days out of the week.  1 glass of wine. Denies unintentional weight loss, fever, night sweats.    Review of Systems  Constitutional:  Negative for appetite change, chills, fatigue and fever.  HENT:   Negative for hearing loss and voice change.   Eyes:  Negative for eye problems.  Respiratory:  Negative for chest tightness and cough.   Cardiovascular:  Negative for chest pain.  Gastrointestinal:  Negative for abdominal distention, abdominal pain and blood in stool.  Endocrine: Negative for hot flashes.  Genitourinary:  Negative for difficulty urinating and frequency.   Musculoskeletal:  Negative for arthralgias.  Skin:  Negative for itching and rash.  Neurological:  Negative for extremity weakness.  Hematological:  Negative for adenopathy.  Psychiatric/Behavioral:  Negative for confusion.     MEDICAL HISTORY:  History reviewed. No pertinent past medical history.  SURGICAL HISTORY: Past Surgical History:  Procedure Laterality Date   BREAST BIOPSY Right 6/16/20231   affirm bx, distortion, x marker, path pending   BREAST CYST ASPIRATION Left years ago   neg   COLONOSCOPY WITH PROPOFOL N/A 11/09/2017   Procedure: COLONOSCOPY WITH PROPOFOL;  Surgeon: Jonathon Bellows, MD;  Location: [facility] ENDOSCOPY;  Service: Gastroenterology;  Laterality: N/A;   NO PAST SURGERIES      SOCIAL HISTORY: Social History   Socioeconomic History   Marital status: Married    Spouse name: Not on file   Number of  children: 2   Years of education: Not on file   Highest education level: High school graduate  Occupational History   Occupation: retired  Tobacco Use   Smoking status: Never   Smokeless tobacco: Never  Vaping Use   Vaping Use: Never used  Substance and Sexual Activity   Alcohol use: Yes    Alcohol/week: 7.0 standard drinks of alcohol    Types: 7 Glasses of wine per week     Comment: 1 glass of wine every day   Drug use: No   Sexual activity: Not on file  Other Topics Concern   Not on file  Social History Narrative   Not on file   Social Determinants of Health   Financial Resource Strain: Low Risk  (10/29/2020)   Overall Financial Resource Strain (CARDIA)    Difficulty of Paying Living Expenses: Not hard at all  Food Insecurity: No Food Insecurity (10/29/2020)   Hunger Vital Sign    Worried About Running Out of Food in the Last Year: Never true    Ran Out of Food in the Last Year: Never true  Transportation Needs: No Transportation Needs (10/29/2020)   PRAPARE - Hydrologist (Medical): No    Lack of Transportation (Non-Medical): No  Physical Activity: Sufficiently Active (10/29/2020)   Exercise Vital Sign    Days of Exercise per Week: 5 days    Minutes of Exercise per Session: 40 min  Stress: Stress Concern Present (10/29/2020)   [place]    Feeling of Stress : To some extent  Social Connections: Moderately Isolated (10/29/2020)   Social Connection and Isolation Panel [NHANES]    Frequency of Communication with Friends and Family: More than three times a week    Frequency of Social Gatherings with Friends and Family: Three times a week    Attends Religious Services: Never    Active Member of Clubs or Organizations: No    Attends Archivist Meetings: Never    Marital Status: Married  Human resources officer Violence: Not At Risk (10/29/2020)   Humiliation, Afraid, Rape, and Kick questionnaire    Fear of Current or Ex-Partner: No    Emotionally Abused: No    Physically Abused: No    Sexually Abused: No    FAMILY HISTORY: Family History  Problem Relation Age of Onset   Hypertension Mother    GER disease Mother    GER disease Father    Hyperlipidemia Father    Diabetes Brother     ALLERGIES:  has no active allergies.  MEDICATIONS:  Current  Outpatient Medications  Medication Sig Dispense Refill   metroNIDAZOLE (METROCREAM) 0.75 % cream Apply topically to face and nose for rosacea qd 45 g 11   omeprazole (PRILOSEC) 20 MG capsule TAKE 1 CAPSULE BY MOUTH EVERY DAY 90 capsule 1   rosuvastatin (CRESTOR) 5 MG tablet Take 1 tablet (5 mg total) by mouth daily. 90 tablet 3   tacrolimus (PROTOPIC) 0.1 % ointment Apply topically to aa face and body qd/bid prn for red scaly patches 30 g 3   traZODone (DESYREL) 100 MG tablet TAKE 0.5-1 TABLETS (50-100 MG TOTAL) BY MOUTH AT BEDTIME. 90 tablet 1   vitamin B-12 (CYANOCOBALAMIN) 1000 MCG tablet Take 1,000 mcg by mouth daily.     Vitamin D, Cholecalciferol, 25 MCG (1000 UT) CAPS Take by mouth daily.     No current facility-administered medications for this visit.  PHYSICAL EXAMINATION: ECOG PERFORMANCE STATUS: 0 - Asymptomatic Vitals:   12/01/22 1118  BP: (!) 186/99  Pulse: 76  Resp: 18  Temp: (!) 96.3 F (35.7 C)  SpO2: 100%   Filed Weights   12/01/22 1118  Weight: 142 lb 3.2 oz (64.5 kg)    Physical Exam Constitutional:      General: She is not in acute distress. HENT:     Head: Normocephalic and atraumatic.  Eyes:     General: No scleral icterus. Cardiovascular:     Rate and Rhythm: Normal rate and regular rhythm.     Heart sounds: Normal heart sounds.  Pulmonary:     Effort: Pulmonary effort is normal. No respiratory distress.     Breath sounds: No wheezing.  Abdominal:     General: Bowel sounds are normal. There is no distension.     Palpations: Abdomen is soft.  Musculoskeletal:        General: No deformity. Normal range of motion.     Cervical back: Normal range of motion and neck supple.  Skin:    General: Skin is warm and dry.     Findings: No erythema or rash.  Neurological:     Mental Status: She is alert and oriented to person, place, and time. Mental status is at baseline.     Cranial Nerves: No cranial nerve deficit.     Coordination: Coordination  normal.  Psychiatric:        Mood and Affect: Mood normal.      LABORATORY DATA:  I have reviewed the data as listed    Latest Ref Rng & Units 11/18/2022   11:28 AM 11/14/2021   11:13 AM 12/28/2017    8:39 AM  CBC  WBC 3.4 - 10.8 x10E3/uL 5.2  6.7  6.0   Hemoglobin 11.1 - 15.9 g/dL 15.5  14.8  14.8   Hematocrit 34.0 - 46.6 % 47.2  44.0  45.0   Platelets 150 - 450 x10E3/uL 201  202        Latest Ref Rng & Units 11/18/2022   11:28 AM 05/19/2022   11:27 AM 11/14/2021   11:13 AM  CMP  Glucose 70 - 99 mg/dL 109  110  111   BUN 8 - 27 mg/dL 13  15  14    Creatinine 0.57 - 1.00 mg/dL 0.77  0.70  0.77   Sodium 134 - 144 mmol/L 138  141  141   Potassium 3.5 - 5.2 mmol/L 4.5  4.2  4.3   Chloride 96 - 106 mmol/L 99  100  101   CO2 20 - 29 mmol/L 23  23  19    Calcium 8.7 - 10.3 mg/dL 10.3  10.0  9.7   Total Protein 6.0 - 8.5 g/dL 7.1  7.4  7.2   Total Bilirubin 0.0 - 1.2 mg/dL 0.7  0.4  0.5   Alkaline Phos 44 - 121 IU/L 67  63  56   AST 0 - 40 IU/L 29  17  24    ALT 0 - 32 IU/L 30  18  16         Component Value Date/Time   IRON 164 (H) 11/18/2022 1128   TIBC 387 11/18/2022 1128   FERRITIN 639 (H) 11/18/2022 1128   IRONPCTSAT 42 11/18/2022 1128      RADIOGRAPHIC STUDIES: I have personally reviewed the radiological images as listed and agreed with the findings in the report.  No results found.

## 2022-12-01 NOTE — Assessment & Plan Note (Signed)
Recommend patient to stop alcohol use.  I will obtain right upper quadrant to liver for further evaluation.

## 2022-12-01 NOTE — Assessment & Plan Note (Signed)
Discussed with the patient that elevated ferritin could be secondary to irregular condition versus reactive etiologies. Check hemochromatosis gene DNA PCR.  Check hepatitis panel.  \

## 2022-12-04 ENCOUNTER — Ambulatory Visit
Admission: RE | Admit: 2022-12-04 | Discharge: 2022-12-04 | Disposition: A | Discharge status: Home / Self Care | Payer: Medicare Other | Source: Ambulatory Visit | Attending: Oncology | Admitting: Oncology

## 2022-12-04 DIAGNOSIS — K8020 Calculus of gallbladder without cholecystitis without obstruction: Secondary | ICD-10-CM | POA: Diagnosis not present

## 2022-12-04 DIAGNOSIS — R7989 Other specified abnormal findings of blood chemistry: Secondary | ICD-10-CM | POA: Insufficient documentation

## 2022-12-10 LAB — HEMOCHROMATOSIS DNA-PCR(C282Y,H63D)

## 2022-12-19 ENCOUNTER — Ambulatory Visit
Admission: RE | Admit: 2022-12-19 | Discharge: 2022-12-19 | Disposition: A | Discharge status: Home / Self Care | Payer: Medicare Other | Source: Ambulatory Visit | Attending: Physician Assistant | Admitting: Physician Assistant

## 2022-12-19 DIAGNOSIS — Z780 Asymptomatic menopausal state: Secondary | ICD-10-CM | POA: Insufficient documentation

## 2022-12-19 DIAGNOSIS — M85852 Other specified disorders of bone density and structure, left thigh: Secondary | ICD-10-CM | POA: Diagnosis not present

## 2022-12-19 DIAGNOSIS — Z13820 Encounter for screening for osteoporosis: Secondary | ICD-10-CM

## 2022-12-19 DIAGNOSIS — M8580 Other specified disorders of bone density and structure, unspecified site: Secondary | ICD-10-CM | POA: Diagnosis not present

## 2023-01-05 ENCOUNTER — Encounter: Payer: Self-pay | Admitting: Oncology

## 2023-01-05 ENCOUNTER — Inpatient Hospital Stay: Payer: Medicare Other | Attending: Oncology | Admitting: Oncology

## 2023-01-05 VITALS — BP 142/92 | HR 84 | Temp 96.2°F | Resp 18 | Wt 139.6 lb

## 2023-01-05 DIAGNOSIS — K8020 Calculus of gallbladder without cholecystitis without obstruction: Secondary | ICD-10-CM | POA: Insufficient documentation

## 2023-01-05 DIAGNOSIS — Z789 Other specified health status: Secondary | ICD-10-CM

## 2023-01-05 DIAGNOSIS — Z833 Family history of diabetes mellitus: Secondary | ICD-10-CM | POA: Diagnosis not present

## 2023-01-05 DIAGNOSIS — M797 Fibromyalgia: Secondary | ICD-10-CM | POA: Diagnosis not present

## 2023-01-05 DIAGNOSIS — R7989 Other specified abnormal findings of blood chemistry: Secondary | ICD-10-CM

## 2023-01-05 DIAGNOSIS — Z8249 Family history of ischemic heart disease and other diseases of the circulatory system: Secondary | ICD-10-CM | POA: Diagnosis not present

## 2023-01-05 DIAGNOSIS — Z7289 Other problems related to lifestyle: Secondary | ICD-10-CM | POA: Diagnosis not present

## 2023-01-05 DIAGNOSIS — Z79899 Other long term (current) drug therapy: Secondary | ICD-10-CM | POA: Insufficient documentation

## 2023-01-05 DIAGNOSIS — Z8349 Family history of other endocrine, nutritional and metabolic diseases: Secondary | ICD-10-CM | POA: Insufficient documentation

## 2023-01-05 DIAGNOSIS — Z8379 Family history of other diseases of the digestive system: Secondary | ICD-10-CM | POA: Insufficient documentation

## 2023-01-05 NOTE — Assessment & Plan Note (Signed)
Labs are reviewed and discussed with patient. Negative for hemochromatosis mutation.  Discussed with patient that the elevated ferritin is probably secondary to alcohol use and/or chronic inflammation from autoimmune disease.  I recommend to repeat iron levels in 3 to 4 months, ideally after she completely stopped alcohol consumption.  Patient would like to defer scheduling another appointment with hematology and she will further discuss with primary care provider.

## 2023-01-05 NOTE — Progress Notes (Signed)
Hematology/Oncology Consult Note [phone] [phone]    REFERRING PROVIDER: Alfredia Ferguson, [name]  CHIEF COMPLAINTS/REASON FOR VISIT:  Evaluation of elevated ferritin level.  ASSESSMENT & PLAN:   Elevated ferritin Labs are reviewed and discussed with patient. Negative for hemochromatosis mutation.  Discussed with patient that the elevated ferritin is probably secondary to alcohol use and/or chronic inflammation from autoimmune disease.  I recommend to repeat iron levels in 3 to 4 months, ideally after she completely stopped alcohol consumption.  Patient would like to defer scheduling another appointment with hematology and she will further discuss with primary care provider.  Alcohol use Recommend patient to stop alcohol use.  Liver ultrasound findings discussed with patient. No ultrasound evidence of liver cirrhosis or lesions. Small gallbladder stone, no evidence of cholecystitis.  All questions were answered. The patient knows to call the clinic with any problems, questions or concerns.  Rickard Patience, MD, PhD [facility] Hematology Oncology 01/05/2023     HISTORY OF PRESENTING ILLNESS:  Melanie Rhodes is a 72 y.o. female who was seen in consultation at the request of Ok Edwards, Lillia Abed, [name] for evaluation of elevated ferritin level Patient was noticed to have elevated ferritin since 11/14/2021, ferritin 565. Ferritin persistently elevated on subsequent follow-up labs. 11/18/2022, ferritin 639, iron saturation 42, TIBC 287.  Patient has normal hemoglobin of 15.5, hematocrit 47.2, normal bilirubin 0.7, normal AST/ALT/alkaline phosphatase.  Context History of previous blood transfusion: denies Shortness of breath: denies Join pain: denies  She drinks alcohol about 5 days out of the week.  1 glass of wine. Denies unintentional weight loss, fever, night sweats.   INTERVAL HISTORY Melanie Rhodes is a 72 y.o. female who has above history reviewed by me today  presents for follow up visit to review results.  She has no new complaints.  She has cut back on alcohol consumption.  Chronic stiffness/body pain.  She reports a history of fibromyalgia, currently not on any treatment due to manageable symptoms.  Review of Systems  Constitutional:  Negative for appetite change, chills, fatigue and fever.  HENT:   Negative for hearing loss and voice change.   Eyes:  Negative for eye problems.  Respiratory:  Negative for chest tightness and cough.   Cardiovascular:  Negative for chest pain.  Gastrointestinal:  Negative for abdominal distention, abdominal pain and blood in stool.  Endocrine: Negative for hot flashes.  Genitourinary:  Negative for difficulty urinating and frequency.   Musculoskeletal:  Negative for arthralgias.  Skin:  Negative for itching and rash.  Neurological:  Negative for extremity weakness.  Hematological:  Negative for adenopathy.  Psychiatric/Behavioral:  Negative for confusion.     MEDICAL HISTORY:  History reviewed. No pertinent past medical history.  SURGICAL HISTORY: Past Surgical History:  Procedure Laterality Date   BREAST BIOPSY Right 02/22/2020   affirm bx, distortion, x marker, benign   BREAST CYST ASPIRATION Left years ago   neg   COLONOSCOPY WITH PROPOFOL N/A 11/09/2017   Procedure: COLONOSCOPY WITH PROPOFOL;  Surgeon: Wyline Mood, MD;  [facility] ENDOSCOPY;  Service: Gastroenterology;  Laterality: N/A;   NO PAST SURGERIES      SOCIAL HISTORY: Social History   Socioeconomic History   Marital status: Married    Spouse name: Not on file   Number of children: 2   Years of education: Not on file   Highest education level: High school graduate  Occupational History   Occupation: retired  Tobacco Use   Smoking status: Never   Smokeless tobacco:  Never  Vaping Use   Vaping Use: Never used  Substance and Sexual Activity   Alcohol use: Yes    Alcohol/week: 7.0 standard drinks of alcohol    Types: 7  Glasses of wine per week    Comment: 1 glass of wine every day   Drug use: No   Sexual activity: Not on file  Other Topics Concern   Not on file  Social History Narrative   Not on file   Social Determinants of Health   Financial Resource Strain: Low Risk  (10/29/2020)   Overall Financial Resource Strain (CARDIA)    Difficulty of Paying Living Expenses: Not hard at all  Food Insecurity: No Food Insecurity (10/29/2020)   Hunger Vital Sign    Worried About Running Out of Food in the Last Year: Never true    Ran Out of Food in the Last Year: Never true  Transportation Needs: No Transportation Needs (10/29/2020)   PRAPARE - Administrator, Civil Service (Medical): No    Lack of Transportation (Non-Medical): No  Physical Activity: Sufficiently Active (10/29/2020)   Exercise Vital Sign    Days of Exercise per Week: 5 days    Minutes of Exercise per Session: 40 min  Stress: Stress Concern Present (10/29/2020)   [employer] of Occupational Health - Occupational Stress Questionnaire    Feeling of Stress : To some extent  Social Connections: Moderately Isolated (10/29/2020)   Social Connection and Isolation Panel [NHANES]    Frequency of Communication with Friends and Family: More than three times a week    Frequency of Social Gatherings with Friends and Family: Three times a week    Attends Religious Services: Never    Active Member of Clubs or Organizations: No    Attends Banker Meetings: Never    Marital Status: Married  Catering manager Violence: Not At Risk (10/29/2020)   Humiliation, Afraid, Rape, and Kick questionnaire    Fear of Current or Ex-Partner: No    Emotionally Abused: No    Physically Abused: No    Sexually Abused: No    FAMILY HISTORY: Family History  Problem Relation Age of Onset   Hypertension Mother    GER disease Mother    GER disease Father    Hyperlipidemia Father    Diabetes Brother     ALLERGIES:  has no active  allergies.  MEDICATIONS:  Current Outpatient Medications  Medication Sig Dispense Refill   metroNIDAZOLE (METROCREAM) 0.75 % cream Apply topically to face and nose for rosacea qd 45 g 11   omeprazole (PRILOSEC) 20 MG capsule TAKE 1 CAPSULE BY MOUTH EVERY DAY 90 capsule 1   rosuvastatin (CRESTOR) 5 MG tablet Take 1 tablet (5 mg total) by mouth daily. 90 tablet 3   tacrolimus (PROTOPIC) 0.1 % ointment Apply topically to aa face and body qd/bid prn for red scaly patches 30 g 3   traZODone (DESYREL) 100 MG tablet TAKE 0.5-1 TABLETS (50-100 MG TOTAL) BY MOUTH AT BEDTIME. 90 tablet 1   vitamin B-12 (CYANOCOBALAMIN) 1000 MCG tablet Take 1,000 mcg by mouth daily.     Vitamin D, Cholecalciferol, 25 MCG (1000 UT) CAPS Take by mouth daily.     No current facility-administered medications for this visit.     PHYSICAL EXAMINATION: ECOG PERFORMANCE STATUS: 0 - Asymptomatic Vitals:   01/05/23 1015  BP: (!) 142/92  Pulse: 84  Resp: 18  Temp: (!) 96.2 F (35.7 C)  SpO2: 99%   Filed  Weights   01/05/23 1015  Weight: 139 lb 9.6 oz (63.3 kg)    Physical Exam Constitutional:      General: She is not in acute distress. HENT:     Head: Normocephalic and atraumatic.  Eyes:     General: No scleral icterus. Cardiovascular:     Rate and Rhythm: Normal rate.     Heart sounds: Normal heart sounds.  Pulmonary:     Effort: Pulmonary effort is normal. No respiratory distress.  Abdominal:     General: There is no distension.  Musculoskeletal:        General: No deformity.     Cervical back: Normal range of motion.  Skin:    Findings: No rash.  Neurological:     Mental Status: She is alert and oriented to person, place, and time. Mental status is at baseline.  Psychiatric:        Mood and Affect: Mood normal.      LABORATORY DATA:  I have reviewed the data as listed    Latest Ref Rng & Units 11/18/2022   11:28 AM 11/14/2021   11:13 AM 12/28/2017    8:39 AM  CBC  WBC 3.4 - 10.8 x10E3/uL  5.2  6.7  6.0   Hemoglobin 11.1 - 15.9 g/dL 16.1  09.6  04.5   Hematocrit 34.0 - 46.6 % 47.2  44.0  45.0   Platelets 150 - 450 x10E3/uL 201  202        Latest Ref Rng & Units 11/18/2022   11:28 AM 05/19/2022   11:27 AM 11/14/2021   11:13 AM  CMP  Glucose 70 - 99 mg/dL 409  811  914   BUN 8 - 27 mg/dL 13  15  14    Creatinine 0.57 - 1.00 mg/dL 7.82  9.56  2.13   Sodium 134 - 144 mmol/L 138  141  141   Potassium 3.5 - 5.2 mmol/L 4.5  4.2  4.3   Chloride 96 - 106 mmol/L 99  100  101   CO2 20 - 29 mmol/L 23  23  19    Calcium 8.7 - 10.3 mg/dL 08.6  57.8  9.7   Total Protein 6.0 - 8.5 g/dL 7.1  7.4  7.2   Total Bilirubin 0.0 - 1.2 mg/dL 0.7  0.4  0.5   Alkaline Phos 44 - 121 IU/L 67  63  56   AST 0 - 40 IU/L 29  17  24    ALT 0 - 32 IU/L 30  18  16         Component Value Date/Time   IRON 164 (H) 11/18/2022 1128   TIBC 387 11/18/2022 1128   FERRITIN 639 (H) 11/18/2022 1128   IRONPCTSAT 42 11/18/2022 1128      RADIOGRAPHIC STUDIES: I have personally reviewed the radiological images as listed and agreed with the findings in the report.  DG Bone Density  Result Date: 12/19/2022 EXAM: DUAL X-RAY ABSORPTIOMETRY (DXA) FOR BONE MINERAL DENSITY IMPRESSION: Your patient Marikay Roads completed a BMD test on 12/19/2022 using the [employer] DXA System (software version: 14.10) manufactured by [employer]. The following summarizes the results of our evaluation. Technologist: SCE PATIENT BIOGRAPHICAL: Name: Coda, Filler Patient ID: [id number] Birth Date: March 01, 1951 Height: 61.5 in. Gender: Female Exam Date: 12/19/2022 Weight: 138.7 lbs. Indications: Advanced Age, Caucasian, fibromyalgia, Parent Hip Fracture, Postmenopausal, Family Hist. (Parent hip fracture) Fractures: Treatments: Omeprazole, Vitamin D DENSITOMETRY RESULTS: Site      Region  Measured Date Measured Age WHO Classification Young Adult T-score BMD         %Change vs. Previous Significant Change (*) AP Spine L1-L2 12/19/2022  71.5 Normal -0.9 1.063 g/cm2 0.9% - AP Spine L1-L2 11/16/2017 66.5 Normal -1.0 1.053 g/cm2 - - DualFemur Total Left 12/19/2022 71.5 Osteopenia -1.6 0.807 g/cm2 -6.4% Yes DualFemur Total Left 11/16/2017 66.5 Osteopenia -1.2 0.862 g/cm2 - - DualFemur Total Mean 12/19/2022 71.5 Osteopenia -1.3 0.843 g/cm2 -4.2% Yes DualFemur Total Mean 11/16/2017 66.5 Normal -1.0 0.880 g/cm2 - - ASSESSMENT: The BMD measured at Femur Total Left is 0.807 g/cm2 with a T-score of -1.6. This patient is considered osteopenic according to World Health Organization [facility]) criteria. The scan quality is good. L3 and L4 was excluded due to degenerative changes. Compared with prior study, there has been no significant change in the spine. Compared with prior study, there has been a significant decrease in the total hip. World Science writer [facility]) criteria for post-menopausal, Caucasian Women: Normal:                   T-score at or above -1 SD Osteopenia/low bone mass: T-score between -1 and -2.5 SD Osteoporosis:             T-score at or below -2.5 SD RECOMMENDATIONS: 1. All patients should optimize calcium and vitamin D intake. 2. Consider FDA-approved medical therapies in postmenopausal women and men aged 26 years and older, based on the following: a. A hip or vertebral(clinical or morphometric) fracture b. T-score < -2.5 at the femoral neck or spine after appropriate evaluation to exclude secondary causes c. Low bone mass (T-score between -1.0 and -2.5 at the femoral neck or spine) and a 10-year probability of a hip fracture > 3% or a 10-year probability of a major osteoporosis-related fracture > 20% based on the [place]-adapted WHO algorithm 3. Clinician judgment and/or patient preferences may indicate treatment for people with 10-year fracture probabilities above or below these levels FOLLOW-UP: People with diagnosed cases of osteoporosis or at high risk for fracture should have regular bone mineral density tests. For patients eligible for  Medicare, routine testing is allowed once every 2 years. The testing frequency can be increased to one year for patients who have rapidly progressing disease, those who are receiving or discontinuing medical therapy to restore bone mass, or have additional risk factors. I have reviewed this report, and agree with the above findings. [facility] Dear Alfredia Ferguson, Your patient Janeva Peaster completed a FRAX assessment on 12/19/2022 using the [facility] iDXA DXA System (analysis version: 14.10) manufactured by [employer]. The following summarizes the results of our evaluation. PATIENT BIOGRAPHICAL: Name: Danniela, Mcbrearty Patient ID: [id number] Birth Date: [birth year] Height:    61.5 in. Gender:     Female    [age]       Weight:    138.7 lbs. Ethnicity:  White                            Exam Date: 12/19/2022 FRAX* RESULTS:  (version: 3.5) 10-year Probability of Fracture1 Major Osteoporotic Fracture2 Hip Fracture 16.4% 4.4% Population: [place] (Caucasian) Risk Factors: Family Hist. (Parent hip fracture) Based on Femur (Left) Neck BMD 1 -The 10-year probability of fracture may be lower than reported if the patient has received treatment. 2 -Major Osteoporotic Fracture: Clinical Spine, Forearm, Hip or Shoulder *FRAX is a Armed forces logistics/support/administrative officer of the [employer]  of [facility] for Metabolic Bone Disease, a World Science writer (WHO) Collaborating Centre. ASSESSMENT: The probability of a major osteoporotic fracture is 16.4% within the next ten years. The probability of a hip fracture is 4.4% within the next ten years. . Electronically Signed   By: Gerome Sam III M.D.   On: 12/19/2022 09:24   US ABDOMEN LIMITED RUQ (LIVER/GB)  Result Date: 12/04/2022 CLINICAL DATA:  Elevated ferritin. EXAM: ULTRASOUND ABDOMEN LIMITED RIGHT UPPER QUADRANT COMPARISON:  None Available. FINDINGS: Gallbladder: Gallstones are noted gallbladder, largest measures 6 mm. No wall thickening visualized. No  sonographic Murphy sign noted by sonographer. Common bile duct: Diameter: 2 mm. Liver: No focal lesion identified. Within normal limits in parenchymal echogenicity. Portal vein is patent on color Doppler imaging with normal direction of blood flow towards the liver. Other: None. IMPRESSION: Cholelithiasis without sonographic evidence of acute cholecystitis. Electronically Signed   By: Sherian Rein M.D.   On: 12/04/2022 08:36

## 2023-01-05 NOTE — Assessment & Plan Note (Signed)
>>  ASSESSMENT AND PLAN FOR ELEVATED FERRITIN WRITTEN ON 01/05/2023  1:05 PM BY Rickard Patience, MD  Labs are reviewed and discussed with patient. Negative for hemochromatosis mutation.  Discussed with patient that the elevated ferritin is probably secondary to alcohol use and/or chronic inflammation from autoimmune disease.  I recommend to repeat iron levels in 3 to 4 months, ideally after she completely stopped alcohol consumption.  Patient would like to defer scheduling another appointment with hematology and she will further discuss with primary care provider.

## 2023-01-05 NOTE — Assessment & Plan Note (Signed)
Recommend patient to stop alcohol use.  Liver ultrasound findings discussed with patient. No ultrasound evidence of liver cirrhosis or lesions. Small gallbladder stone, no evidence of cholecystitis.

## 2023-02-12 ENCOUNTER — Other Ambulatory Visit: Payer: Self-pay | Admitting: Physician Assistant

## 2023-02-12 DIAGNOSIS — Z1231 Encounter for screening mammogram for malignant neoplasm of breast: Secondary | ICD-10-CM

## 2023-03-05 ENCOUNTER — Ambulatory Visit
Admission: RE | Admit: 2023-03-05 | Discharge: 2023-03-05 | Disposition: A | Discharge status: Home / Self Care | Payer: Medicare Other | Source: Ambulatory Visit | Attending: Physician Assistant | Admitting: Physician Assistant

## 2023-03-05 DIAGNOSIS — Z1231 Encounter for screening mammogram for malignant neoplasm of breast: Secondary | ICD-10-CM | POA: Insufficient documentation

## 2023-03-26 ENCOUNTER — Encounter: Payer: Self-pay | Admitting: Physician Assistant

## 2023-03-31 ENCOUNTER — Other Ambulatory Visit: Payer: Self-pay

## 2023-03-31 DIAGNOSIS — E782 Mixed hyperlipidemia: Secondary | ICD-10-CM

## 2023-03-31 MED ORDER — ROSUVASTATIN CALCIUM 10 MG PO TABS
10.0000 mg | ORAL_TABLET | Freq: Every day | ORAL | 1 refills | Status: DC
Start: 2023-03-31 — End: 2023-09-30

## 2023-04-18 ENCOUNTER — Other Ambulatory Visit: Payer: Self-pay | Admitting: Physician Assistant

## 2023-04-18 DIAGNOSIS — G4700 Insomnia, unspecified: Secondary | ICD-10-CM

## 2023-04-18 DIAGNOSIS — R12 Heartburn: Secondary | ICD-10-CM

## 2023-05-26 ENCOUNTER — Ambulatory Visit (INDEPENDENT_AMBULATORY_CARE_PROVIDER_SITE_OTHER): Payer: Medicare Other | Admitting: Dermatology

## 2023-05-26 VITALS — BP 163/91 | HR 68

## 2023-05-26 DIAGNOSIS — W908XXA Exposure to other nonionizing radiation, initial encounter: Secondary | ICD-10-CM | POA: Diagnosis not present

## 2023-05-26 DIAGNOSIS — L821 Other seborrheic keratosis: Secondary | ICD-10-CM

## 2023-05-26 DIAGNOSIS — L820 Inflamed seborrheic keratosis: Secondary | ICD-10-CM | POA: Diagnosis not present

## 2023-05-26 DIAGNOSIS — I781 Nevus, non-neoplastic: Secondary | ICD-10-CM | POA: Diagnosis not present

## 2023-05-26 DIAGNOSIS — L578 Other skin changes due to chronic exposure to nonionizing radiation: Secondary | ICD-10-CM | POA: Diagnosis not present

## 2023-05-26 DIAGNOSIS — L219 Seborrheic dermatitis, unspecified: Secondary | ICD-10-CM

## 2023-05-26 DIAGNOSIS — D229 Melanocytic nevi, unspecified: Secondary | ICD-10-CM | POA: Diagnosis not present

## 2023-05-26 DIAGNOSIS — L814 Other melanin hyperpigmentation: Secondary | ICD-10-CM | POA: Diagnosis not present

## 2023-05-26 DIAGNOSIS — L209 Atopic dermatitis, unspecified: Secondary | ICD-10-CM

## 2023-05-26 DIAGNOSIS — Z1283 Encounter for screening for malignant neoplasm of skin: Secondary | ICD-10-CM

## 2023-05-26 DIAGNOSIS — L719 Rosacea, unspecified: Secondary | ICD-10-CM | POA: Diagnosis not present

## 2023-05-26 DIAGNOSIS — L2089 Other atopic dermatitis: Secondary | ICD-10-CM

## 2023-05-26 MED ORDER — TACROLIMUS 0.1 % EX OINT
TOPICAL_OINTMENT | CUTANEOUS | 3 refills | Status: DC
Start: 2023-05-26 — End: 2024-06-06

## 2023-05-26 MED ORDER — FLUOCINOLONE ACETONIDE 0.01 % OT OIL: TOPICAL_OIL | OTIC | 5 refills | Status: AC

## 2023-05-26 MED ORDER — METRONIDAZOLE 0.75 % EX CREA
TOPICAL_CREAM | CUTANEOUS | 11 refills | Status: DC
Start: 2023-05-26 — End: 2024-06-06

## 2023-05-26 MED ORDER — KETOCONAZOLE 2 % EX SHAM: MEDICATED_SHAMPOO | CUTANEOUS | 11 refills | Status: DC

## 2023-05-26 NOTE — Progress Notes (Signed)
Follow-Up Visit   Subjective  Melanie Rhodes is a 72 y.o. female who presents for the following: Skin Cancer Screening and Full Body Skin Exam  The patient presents for Total-Body Skin Exam (TBSE) for skin cancer screening and mole check. The patient has spots, moles and lesions to be evaluated, some may be new or changing. She has several itchy, irritated spots on her body that she will point out. She uses metronidazole 0.75% cream to the face for rosacea, as needed.  She has atopic dermatitis vs seborrheic dermatitis of the face, using tacrolimus ointment as needed.    The following portions of the chart were reviewed this encounter and updated as appropriate: medications, allergies, medical history  Review of Systems:  No other skin or systemic complaints except as noted in HPI or Assessment and Plan.  Objective  Well appearing patient in no apparent distress; mood and affect are within normal limits.  A full examination was performed including scalp, head, eyes, ears, nose, lips, neck, chest, axillae, abdomen, back, buttocks, bilateral upper extremities, bilateral lower extremities, hands, feet, fingers, toes, fingernails, and toenails. All findings within normal limits unless otherwise noted below.   Relevant physical exam findings are noted in the Assessment and Plan.  left shoulder x 1, left lateral cheek x 1, left lower back x 1 (3) Erythematous stuck-on, waxy papule or plaque    Assessment & Plan   SKIN CANCER SCREENING PERFORMED TODAY.  ACTINIC DAMAGE - Chronic condition, secondary to cumulative UV/sun exposure - diffuse scaly erythematous macules with underlying dyspigmentation - Recommend daily broad spectrum sunscreen SPF 30+ to sun-exposed areas, reapply every 2 hours as needed.  - Staying in the shade or wearing long sleeves, sun glasses (UVA+UVB protection) and wide brim hats (4-inch brim around the entire circumference of the hat) are also recommended for sun  protection.  - Call for new or changing lesions.  LENTIGINES, SEBORRHEIC KERATOSES, HEMANGIOMAS - Benign normal skin lesions - Benign-appearing - Call for any changes  MELANOCYTIC NEVI - Tan-brown and/or pink-flesh-colored symmetric macules and papules - Benign appearing on exam today - Observation - Call clinic for new or changing moles - Recommend daily use of broad spectrum spf 30+ sunscreen to sun-exposed areas.   ROSACEA Exam Mid face erythema with telangiectasias; resolving inflammatory papules on the malar cheeks R > L   Chronic and persistent condition with duration or expected duration over one year. Condition is symptomatic/ bothersome to patient. Not currently at goal.   Rosacea is a chronic progressive skin condition usually affecting the face of adults, causing redness and/or acne bumps. It is treatable but not curable. It sometimes affects the eyes (ocular rosacea) as well. It may respond to topical and/or systemic medication and can flare with stress, sun exposure, alcohol, exercise, topical steroids (including hydrocortisone/cortisone 10) and some foods.  Daily application of broad spectrum spf 30+ sunscreen to face is recommended to reduce flares.  Patient denies grittiness of the eyes.  Treatment Plan Continue metronidazole 0.75% cream Apply to face at bedtime for rosacea dsp 45g 6Rf increase to nightly rather than prn  SEBORRHEIC DERMATITIS Exam: Pink patches with greasy scale at bil temporal hairline, postauricular, scalp  Chronic and persistent condition with duration or expected duration over one year. Condition is symptomatic/ bothersome to patient. Not currently at goal.   Seborrheic Dermatitis is a chronic persistent rash characterized by pinkness and scaling most commonly of the mid face but also can occur on the scalp (dandruff), ears; mid  chest, mid back and groin.  It tends to be exacerbated by stress and cooler weather.  People who have neurologic  disease may experience new onset or exacerbation of existing seborrheic dermatitis.  The condition is not curable but treatable and can be controlled.  Treatment Plan: Start Dermotic oil Apply 1-2 drops to ears nightly as needed for itch and scale dsp 20mL 5Rf.  Restart ketoconazole 2% shampoo massage into scalp and let sit several minutes before rinsing.   Inflamed seborrheic keratosis (3) left shoulder x 1, left lateral cheek x 1, left lower back x 1  Symptomatic, irritating, patient would like treated.  Destruction of lesion - left shoulder x 1, left lateral cheek x 1, left lower back x 1 (3)  Destruction method: cryotherapy   Informed consent: discussed and consent obtained   Lesion destroyed using liquid nitrogen: Yes   Region frozen until ice ball extended beyond lesion: Yes   Outcome: patient tolerated procedure well with no complications   Post-procedure details: wound care instructions given   Additional details:  Prior to procedure, discussed risks of blister formation, small wound, skin dyspigmentation, or rare scar following cryotherapy. Recommend Vaseline ointment to treated areas while healing.   Rosacea  Related Medications metroNIDAZOLE (METROCREAM) 0.75 % cream Apply topically to face for rosacea every night; use twice daily for flares.  Atopic dermatitis, unspecified type  Related Medications tacrolimus (PROTOPIC) 0.1 % ointment Apply topically to aa face and body qd/bid prn for red scaly patches  ATOPIC DERMATITIS   Exam: Scaly pink patches bilateral inferior jaw, spinal upper back; mild erythema/scale bilateral antecubital  1% BSA  Chronic and persistent condition with duration or expected duration over one year. Condition is symptomatic/ bothersome to patient. Not currently at goal.   Atopic dermatitis (eczema) is a chronic, relapsing, pruritic condition that can significantly affect quality of life. It is often associated with allergic rhinitis and/or  asthma and can require treatment with topical medications, phototherapy, or in severe cases biologic injectable medication (Dupixent; Adbry) or Oral JAK inhibitors.  Treatment Plan: Continue tacrolimus ointment Apply to red, itchy rash on face and body once to twice daily as needed   Recommend gentle skin care.  Spider Veins - Dilated blue, purple or red veins at the lower extremities - Reassured - Smaller vessels can be treated by sclerotherapy (a procedure to inject a medicine into the veins to make them disappear) if desired, but the treatment is not covered by insurance. Larger vessels may be covered if symptomatic and we would refer to vascular surgeon if treatment desired.  Return in about 1 year (around 05/25/2024) for TBSE.  ICherlyn Labella, CMA, am acting as scribe for Willeen Niece, MD .   Documentation: I have reviewed the above documentation for accuracy and completeness, and I agree with the above.  Willeen Niece, MD

## 2023-05-26 NOTE — Patient Instructions (Addendum)
Cryotherapy Aftercare  Wash gently with soap and water everyday.   Apply Vaseline and Band-Aid daily until healed.   Gentle Skin Care Guide  1. Bathe no more than once a day.  2. Avoid bathing in hot water  3. Use a mild soap like Dove, Vanicream, Cetaphil, CeraVe. Can use Lever 2000 or Cetaphil antibacterial soap  4. Use soap only where you need it. On most days, use it under your arms, between your legs, and on your feet. Let the water rinse other areas unless visibly dirty.  5. When you get out of the bath/shower, use a towel to gently blot your skin dry, don't rub it.  6. While your skin is still a little damp, apply a moisturizing cream such as Vanicream, CeraVe, Cetaphil, Eucerin, Sarna lotion or plain Vaseline Jelly. For hands apply Neutrogena [place] Hand Cream or Excipial Hand Cream.  7. Reapply moisturizer any time you start to itch or feel dry.  8. Sometimes using free and clear laundry detergents can be helpful. Fabric softener sheets should be avoided. Downy Free & Gentle liquid, or any liquid fabric softener that is free of dyes and perfumes, it acceptable to use  9. If your doctor has given you prescription creams you may apply moisturizers over them     Rosacea  What is rosacea? Rosacea (say: ro-zay-sha) is a common skin disease that usually begins as a trend of flushing or blushing easily.  As rosacea progresses, a persistent redness in the center of the face will develop and may gradually spread beyond the nose and cheeks to the forehead and chin.  In some cases, the ears, chest, and back could be affected.  Rosacea may appear as tiny blood vessels or small red bumps that occur in crops.  Frequently they can contain pus, and are called "pustules".  If the bumps do not contain pus, they are referred to as "papules".  Rarely, in prolonged, untreated cases of rosacea, the oil glands of the nose and cheeks may become permanently enlarged.  This is called rhinophyma, and  is seen more frequently in men.  Signs and Risks In its beginning stages, rosacea tends to come and go, which makes it difficult to recognize.  It can start as intermittent flushing of the face.  Eventually, blood vessels may become permanently visible.  Pustules and papules can appear, but can be mistaken for adult acne.  People of all races, ages, genders and ethnic groups are at risk of developing rosacea.  However, it is more common in women (especially around menopause) and adults with fair skin between the ages of 46 and 66.  Treatment Dermatologists typically recommend a combination of treatments to effectively manage rosacea.  Treatment can improve symptoms and may stop the progression of the rosacea.  Treatment may involve both topical and oral medications.  The tetracycline antibiotics are often used for their anti-inflammatory effect; however, because of the possibility of developing antibiotic resistance, they should not be used long term at full dose.  For dilated blood vessels the options include electrodessication (uses electric current through a small needle), laser treatment, and cosmetics to hide the redness.   With all forms of treatment, improvement is a slow process, and patients may not see any results for the first 3-4 weeks.  It is very important to avoid the sun and other triggers.  Patients must wear sunscreen daily.  Skin Care Instructions: Cleanse the skin with a mild soap such as CeraVe cleanser, Cetaphil cleanser, or  Dove soap once or twice daily as needed. Moisturize with Eucerin Redness Relief Daily Perfecting Lotion (has a subtle green tint), CeraVe Moisturizing Cream, or Oil of Olay Daily Moisturizer with sunscreen every morning and/or night as recommended. Makeup should be "non-comedogenic" (won't clog pores) and be labeled "for sensitive skin". Good choices for cosmetics are: Neutrogena, Almay, and Physician's Formula.  Any product with a green tint tends to offset a  red complexion. If your eyes are dry and irritated, use artificial tears 2-3 times per day and cleanse the eyelids daily with baby shampoo.  Have your eyes examined at least every 2 years.  Be sure to tell your eye doctor that you have rosacea. Alcoholic beverages tend to cause flushing of the skin, and may make rosacea worse. Always wear sunscreen, protect your skin from extreme hot and cold temperatures, and avoid spicy foods, hot drinks, and mechanical irritation such as rubbing, scrubbing, or massaging the face.  Avoid harsh skin cleansers, cleansing masks, astringents, and exfoliation. If a particular product burns or makes your face feel tight, then it is likely to flare your rosacea. If you are having difficulty finding a sunscreen that you can tolerate, you may try switching to a chemical-free sunscreen.  These are ones whose active ingredient is zinc oxide or titanium dioxide only.  They should also be fragrance free, non-comedogenic, and labeled for sensitive skin. Rosacea triggers may vary from person to person.  There are a variety of foods that have been reported to trigger rosacea.  Some patients find that keeping a diary of what they were doing when they flared helps them avoid triggers.   Due to recent changes in healthcare laws, you may see results of your pathology and/or laboratory studies on MyChart before the doctors have had a chance to review them. We understand that in some cases there may be results that are confusing or concerning to you. Please understand that not all results are received at the same time and often the doctors may need to interpret multiple results in order to provide you with the best plan of care or course of treatment. Therefore, we ask that you please give [place] 2 business days to thoroughly review all your results before contacting the office for clarification. Should we see a critical lab result, you will be contacted sooner.   If You Need Anything After Your  Visit  If you have any questions or concerns for your doctor, please call our main line at [phone] and press option 4 to reach your doctor's medical assistant. If no one answers, please leave a voicemail as directed and we will return your call as soon as possible. Messages left after 4 pm will be answered the following business day.   You may also send [place] a message via MyChart. We typically respond to MyChart messages within 1-2 business days.  For prescription refills, please ask your pharmacy to contact our office. Our fax number is [phone].  If you have an urgent issue when the clinic is closed that cannot wait until the next business day, you can page your doctor at the number below.    Please note that while we do our best to be available for urgent issues outside of office hours, we are not available 24/7.   If you have an urgent issue and are unable to reach [place], you may choose to seek medical care at your doctor's office, retail clinic, urgent care center, or emergency room.  If you have  a medical emergency, please immediately call 911 or go to the emergency department.  Pager Numbers  - Dr. Gwen Pounds: [phone]  - Dr. Roseanne Reno: [phone]  - Dr. Katrinka Blazing: [phone]   In the event of inclement weather, please call our main line at [phone] for an update on the status of any delays or closures.  Dermatology Medication Tips: Please keep the boxes that topical medications come in in order to help keep track of the instructions about where and how to use these. Pharmacies typically print the medication instructions only on the boxes and not directly on the medication tubes.   If your medication is too expensive, please contact our office at [phone] option 4 or send [place] a message through MyChart.   We are unable to tell what your co-pay for medications will be in advance as this is different depending on your insurance coverage. However, we may be able to find a  substitute medication at lower cost or fill out paperwork to get insurance to cover a needed medication.   If a prior authorization is required to get your medication covered by your insurance company, please allow [place] 1-2 business days to complete this process.  Drug prices often vary depending on where the prescription is filled and some pharmacies may offer cheaper prices.  The website www.goodrx.com contains coupons for medications through different pharmacies. The prices here do not account for what the cost may be with help from insurance (it may be cheaper with your insurance), but the website can give you the price if you did not use any insurance.  - You can print the associated coupon and take it with your prescription to the pharmacy.  - You may also stop by our office during regular business hours and pick up a GoodRx coupon card.  - If you need your prescription sent electronically to a different pharmacy, notify our office through [facility] or by phone at [phone] option 4.     Si Usted Necesita Algo Despus de Su Visita  Tambin puede enviarnos un mensaje a travs de Clinical cytogeneticist. Por lo general respondemos a los mensajes de MyChart en el transcurso de 1 a 2 das hbiles.  Para renovar recetas, por favor pida a su farmacia que se ponga en contacto con nuestra oficina. Annie Sable de fax es [place] [phone].  Si tiene un asunto urgente cuando la clnica est cerrada y que no puede esperar hasta el siguiente da hbil, puede llamar/localizar a su doctor(a) al nmero que aparece a continuacin.   Por favor, tenga en cuenta que aunque hacemos todo lo posible para estar disponibles para asuntos urgentes fuera del horario de [place], no estamos disponibles las 24 horas del da, los 7 [address]  [address] de la [place].   Si tiene un problema urgente y no puede comunicarse con nosotros, puede optar por buscar atencin mdica  en el consultorio de su doctor(a), en una clnica privada, en un  centro de atencin urgente o en una sala de emergencias.  Si tiene Engineer, drilling, por favor llame inmediatamente al 911 o vaya a la sala de emergencias.  Nmeros de bper  - Dr. Gwen Pounds: [phone]  - Dra. Roseanne Reno: [phone]  - Dr. Katrinka Blazing: [phone]   En caso de inclemencias del tiempo, por favor llame a Lacy Duverney principal al [phone] para una actualizacin sobre el [place] de cualquier retraso o cierre.  Consejos para la medicacin en dermatologa: Por favor, guarde las cajas en las que vienen los medicamentos de [employer]  tpico para ayudarle a seguir las instrucciones sobre dnde y cmo usarlos. Las farmacias generalmente imprimen las instrucciones del medicamento slo en las cajas y no directamente en los tubos del [place].   Si su medicamento es muy caro, por favor, pngase en contacto con Rolm Gala llamando al [phone] y presione la opcin 4 o envenos un mensaje a travs de Clinical cytogeneticist.   No podemos decirle cul ser su copago por los medicamentos por adelantado ya que esto es diferente dependiendo de la cobertura de su seguro. Sin embargo, es posible que podamos encontrar un medicamento sustituto a Audiological scientist un formulario para que el seguro cubra el medicamento que se considera necesario.   Si se requiere una autorizacin previa para que su compaa de seguros [place] su medicamento, por favor permtanos de 1 a 2 das hbiles para completar [address]  Los precios de los medicamentos varan con frecuencia dependiendo del Environmental consultant de dnde se surte la receta y alguna farmacias pueden ofrecer precios ms baratos.  El sitio web www.goodrx.com tiene cupones para medicamentos de Health and safety inspector. Los precios aqu no tienen en cuenta lo que podra costar con la ayuda del seguro (puede ser ms barato con su seguro), pero el sitio web puede darle el precio si no utiliz Tourist information centre manager.  - Puede imprimir el cupn correspondiente y llevarlo con su  receta a la farmacia.  - Tambin puede pasar por nuestra oficina durante el horario de atencin regular y Education officer, museum una tarjeta de cupones de GoodRx.  - Si necesita que su receta se enve electrnicamente a una farmacia diferente, informe a nuestra oficina a travs de MyChart de [place] o por telfono llamando al [phone] y presione la opcin 4.

## 2023-06-02 ENCOUNTER — Telehealth: Payer: Self-pay

## 2023-06-02 NOTE — Telephone Encounter (Signed)
LOV was with Drubel 11/18/2022

## 2023-06-02 NOTE — Telephone Encounter (Signed)
Copied from CRM [id number]. Topic: General - Other >> Jun 02, 2023  1:04 PM [place] B wrote: Reason for CRM: pt called in to have order for fu blood work >> Jun 02, 2023  1:15 PM Turkey B wrote: Pt also wants to have flu shot done along with labwork

## 2023-06-17 ENCOUNTER — Ambulatory Visit: Payer: Medicare Other | Admitting: Family Medicine

## 2023-06-24 ENCOUNTER — Ambulatory Visit (INDEPENDENT_AMBULATORY_CARE_PROVIDER_SITE_OTHER): Payer: Medicare Other | Admitting: Family Medicine

## 2023-06-24 ENCOUNTER — Encounter: Payer: Self-pay | Admitting: Family Medicine

## 2023-06-24 VITALS — BP 180/102 | HR 83 | Temp 97.8°F | Ht 62.0 in | Wt 140.5 lb

## 2023-06-24 DIAGNOSIS — E782 Mixed hyperlipidemia: Secondary | ICD-10-CM

## 2023-06-24 DIAGNOSIS — L719 Rosacea, unspecified: Secondary | ICD-10-CM | POA: Diagnosis not present

## 2023-06-24 DIAGNOSIS — E0500 Thyrotoxicosis with diffuse goiter without thyrotoxic crisis or storm: Secondary | ICD-10-CM

## 2023-06-24 DIAGNOSIS — L309 Dermatitis, unspecified: Secondary | ICD-10-CM | POA: Insufficient documentation

## 2023-06-24 DIAGNOSIS — R7989 Other specified abnormal findings of blood chemistry: Secondary | ICD-10-CM | POA: Diagnosis not present

## 2023-06-24 DIAGNOSIS — R030 Elevated blood-pressure reading, without diagnosis of hypertension: Secondary | ICD-10-CM

## 2023-06-24 DIAGNOSIS — Z23 Encounter for immunization: Secondary | ICD-10-CM

## 2023-06-24 NOTE — Assessment & Plan Note (Addendum)
Patient brings log of home blood pressures with her to each visit. Today, log shows home BPs 110s-130s/60s-80; with one SBP of 143.  Will not start patient on blood pressure medication today due to her age and potential for her pressure to become too low at home, given readings in the 110s, leading to increased risk of fall/fracture/head injury.

## 2023-06-24 NOTE — Assessment & Plan Note (Signed)
Continue rosuvastatin 10 mg daily Will recheck level today

## 2023-06-24 NOTE — Assessment & Plan Note (Signed)
Continue MetroCream as needed

## 2023-06-24 NOTE — Assessment & Plan Note (Signed)
Will recheck thyroid levels today.

## 2023-06-24 NOTE — Assessment & Plan Note (Addendum)
Patient negative for hepatitis and hemochromatosis ordered during hem/onc visit We will go ahead and order rheumatological studies as noted below. If indicated, we will re-refer patient to rheumatology for further evaluation (previously saw someone in [place]). Did discuss possibly stopping all alcohol use, which currently is 1 glass of wine 5 out of 7 days/week, if her ferritin remains elevated and other studies are negative.

## 2023-06-24 NOTE — Progress Notes (Signed)
Established patient visit   Patient: Melanie Rhodes   DOB: 12/18/1950   72 y.o. Female  MRN: [id number] Visit Date: 06/24/2023  Today's healthcare provider: Sherlyn Hay, DO   Chief Complaint  Patient presents with   Follow-up    Blood work follow up   Subjective    HPI Last mAWV 11/18/2022 DEXA 12/19/2022 Mammogram 03/04/2024  Due for colonoscopy - knows she's due  - a lot going on psychosocially.  Saw hem/onc 01/05/2023  - Stopped alcohol use? Routinely drinks one glass of wine with dinner, 5 out of 7 days, on average; unchanged.  - Consider autoimmune workup  - Did previously test positive SLE in the past but then was told by rheumatologist she does not have it  Dermatology told her she has rosacea and she has a cream for it  - it was better but has worsened again; she notes small raised bumps on her cheeks, esp. the left side.  - she will be increasing frequency of using MetroCream again  BP high here Home BPs 110s-130s/60s-80; with one SBP of 143     Medications: Outpatient Medications Prior to Visit  Medication Sig   Fluocinolone Acetonide 0.01 % OIL Apply 1-2 drops to ears nightly as needed for itch and scale.   ketoconazole (NIZORAL) 2 % shampoo Massage into scalp daily and let sit several minutes before rinsing. Decrease to 2-3 times weekly for maintenance.   metroNIDAZOLE (METROCREAM) 0.75 % cream Apply topically to face for rosacea every night; use twice daily for flares.   omeprazole (PRILOSEC) 20 MG capsule TAKE 1 CAPSULE BY MOUTH EVERY DAY   rosuvastatin (CRESTOR) 10 MG tablet Take 1 tablet (10 mg total) by mouth daily.   tacrolimus (PROTOPIC) 0.1 % ointment Apply topically to aa face and body qd/bid prn for red scaly patches   traZODone (DESYREL) 100 MG tablet TAKE 0.5-1 TABLETS (50-100 MG TOTAL) BY MOUTH AT BEDTIME.   vitamin B-12 (CYANOCOBALAMIN) 1000 MCG tablet Take 1,000 mcg by mouth daily.   Vitamin D, Cholecalciferol, 25 MCG (1000 UT) CAPS  Take by mouth daily. Patient takes 2 capsules daily.   No facility-administered medications prior to visit.    Review of Systems  Constitutional:  Negative for appetite change, chills, fatigue and fever.  Respiratory:  Negative for chest tightness and shortness of breath.   Cardiovascular:  Negative for chest pain and palpitations.       +chest heaviness when anxiety is high  Gastrointestinal:  Negative for abdominal pain, nausea and vomiting.  Skin:  Positive for rash.  Neurological:  Negative for dizziness and weakness.        Objective    BP (!) 180/102   Pulse 83   Temp 97.8 F (36.6 C) (Oral)   Ht 5\' 2"  (1.575 m)   Wt 140 lb 8 oz (63.7 kg)   SpO2 99%   BMI 25.70 kg/m     Physical Exam Vitals and nursing note reviewed.  Constitutional:      General: She is not in acute distress.    Appearance: Normal appearance.  HENT:     Head: Normocephalic and atraumatic.  Eyes:     General: No scleral icterus.    Conjunctiva/sclera: Conjunctivae normal.  Cardiovascular:     Rate and Rhythm: Normal rate.  Pulmonary:     Effort: Pulmonary effort is normal.  Neurological:     Mental Status: She is alert and oriented to person, place, and time. Mental  status is at baseline.  Psychiatric:        Mood and Affect: Mood normal.        Behavior: Behavior normal.       No results found for any visits on 06/24/23.  Assessment & Plan    Mixed hyperlipidemia Assessment & Plan: Continue rosuvastatin 10 mg daily Will recheck level today  Orders: -     Comprehensive metabolic panel -     Lipid panel  White coat syndrome without diagnosis of hypertension Assessment & Plan: Patient brings log of home blood pressures with her to each visit. Today, log shows home BPs 110s-130s/60s-80; with one SBP of 143.  Will not start patient on blood pressure medication today due to her age and potential for her pressure to become too low at home, given readings in the 110s, leading to  increased risk of fall/fracture/head injury.   High serum ferritin Assessment & Plan: Patient negative for hepatitis and hemochromatosis ordered during hem/onc visit We will go ahead and order rheumatological studies as noted below. If indicated, we will re-refer patient to rheumatology for further evaluation (previously saw someone in [place]).  Orders: -     Iron, TIBC and Ferritin Panel -     Ferritin -     ANA 12Plus Profile, Do All RDL -     Rheumatoid factor -     C-reactive protein  Needs flu shot -     Flu Vaccine Trivalent High Dose (Fluad)  Graves' disease in remission Assessment & Plan: Will recheck thyroid levels today.  Orders: -     TSH Rfx on Abnormal to Free T4  Rosacea Assessment & Plan: Continue MetroCream as needed    Return in about 3 months (around 09/24/2023) for CPE, ferritin.      I discussed the assessment and treatment plan with the patient  The patient was provided an opportunity to ask questions and all were answered. The patient agreed with the plan and demonstrated an understanding of the instructions.   The patient was advised to call back or seek an in-person evaluation if the symptoms worsen or if the condition fails to improve as anticipated.  Total time was 40 minutes. That includes chart review before the visit, the actual patient visit, and time spent on documentation after the visit.     Sherlyn Hay, DO  [facility] [phone] (phone) [phone] (fax)  [facility]

## 2023-06-25 ENCOUNTER — Telehealth: Payer: Self-pay

## 2023-06-25 ENCOUNTER — Other Ambulatory Visit: Payer: Self-pay | Admitting: *Deleted

## 2023-06-25 ENCOUNTER — Telehealth: Payer: Self-pay | Admitting: *Deleted

## 2023-06-25 DIAGNOSIS — E0500 Thyrotoxicosis with diffuse goiter without thyrotoxic crisis or storm: Secondary | ICD-10-CM | POA: Diagnosis not present

## 2023-06-25 DIAGNOSIS — R7989 Other specified abnormal findings of blood chemistry: Secondary | ICD-10-CM | POA: Diagnosis not present

## 2023-06-25 DIAGNOSIS — Z860100 Personal history of colon polyps, unspecified: Secondary | ICD-10-CM

## 2023-06-25 DIAGNOSIS — E782 Mixed hyperlipidemia: Secondary | ICD-10-CM | POA: Diagnosis not present

## 2023-06-25 MED ORDER — NA SULFATE-K SULFATE-MG SULF 17.5-3.13-1.6 GM/177ML PO SOLN: 1.0000 | Freq: Once | ORAL | 0 refills | Status: AC

## 2023-06-25 NOTE — Addendum Note (Signed)
Addended by: Tawnya Crook on: 06/25/2023 03:04 PM   Modules accepted: Orders

## 2023-06-25 NOTE — Telephone Encounter (Signed)
Gastroenterology Pre-Procedure Review  Request Date: 07/23/2023 Requesting Physician: Dr. Tobi Bastos  PATIENT REVIEW QUESTIONS: The patient responded to the following health history questions as indicated:    1. Are you having any GI issues? no 2. Do you have a personal history of Polyps? yes (11/09/2017) 3. Do you have a family history of Colon Cancer or Polyps? no 4. Diabetes Mellitus? no 5. Joint replacements in the past 12 months?no 6. Major health problems in the past 3 months?no 7. Any artificial heart valves, MVP, or defibrillator?no    MEDICATIONS & ALLERGIES:    Patient reports the following regarding taking any anticoagulation/antiplatelet therapy:   Plavix, Coumadin, Eliquis, Xarelto, Lovenox, Pradaxa, Brilinta, or Effient? no Aspirin? no  Patient confirms/reports the following medications:  Current Outpatient Medications  Medication Sig Dispense Refill   Fluocinolone Acetonide 0.01 % OIL Apply 1-2 drops to ears nightly as needed for itch and scale. 20 mL 5   ketoconazole (NIZORAL) 2 % shampoo Massage into scalp daily and let sit several minutes before rinsing. Decrease to 2-3 times weekly for maintenance. 120 mL 11   metroNIDAZOLE (METROCREAM) 0.75 % cream Apply topically to face for rosacea every night; use twice daily for flares. 45 g 11   Na Sulfate-K Sulfate-Mg Sulf 17.5-3.13-1.6 GM/177ML SOLN Take 1 kit by mouth once for 1 dose. 354 mL 0   omeprazole (PRILOSEC) 20 MG capsule TAKE 1 CAPSULE BY MOUTH EVERY DAY 90 capsule 1   rosuvastatin (CRESTOR) 10 MG tablet Take 1 tablet (10 mg total) by mouth daily. 90 tablet 1   tacrolimus (PROTOPIC) 0.1 % ointment Apply topically to aa face and body qd/bid prn for red scaly patches 30 g 3   traZODone (DESYREL) 100 MG tablet TAKE 0.5-1 TABLETS (50-100 MG TOTAL) BY MOUTH AT BEDTIME. 90 tablet 1   vitamin B-12 (CYANOCOBALAMIN) 1000 MCG tablet Take 1,000 mcg by mouth daily.     Vitamin D, Cholecalciferol, 25 MCG (1000 UT) CAPS Take by mouth  daily. Patient takes 2 capsules daily.     No current facility-administered medications for this visit.    Patient confirms/reports the following allergies:  No Active Allergies  No orders of the defined types were placed in this encounter.   AUTHORIZATION INFORMATION Primary Insurance: 1D#: Group #:  Secondary Insurance: 1D#: Group #:  SCHEDULE INFORMATION: Date: 07/23/2023 Time: Location:  [facility]

## 2023-06-25 NOTE — Telephone Encounter (Signed)
Colonoscopy schedule for 07/23/2023 with Dr Tobi Bastos got her repeat.

## 2023-06-25 NOTE — Telephone Encounter (Signed)
Patient called in and left a voicemail stating she want to schedule colonoscopy. I called her back to let know that I send the message to the scheduler and they will give her a call back.

## 2023-06-30 ENCOUNTER — Other Ambulatory Visit: Payer: Self-pay | Admitting: Family Medicine

## 2023-06-30 DIAGNOSIS — R7989 Other specified abnormal findings of blood chemistry: Secondary | ICD-10-CM

## 2023-06-30 NOTE — Plan of Care (Signed)
[facility]L Tonsillectomy/Adenoidectomy,Postoperative PEDs care plan entered in error.

## 2023-07-02 ENCOUNTER — Encounter: Payer: Self-pay | Admitting: Family Medicine

## 2023-07-09 LAB — COMPREHENSIVE METABOLIC PANEL
ALT: 29 [IU]/L (ref 0–32)
AST: 29 [IU]/L (ref 0–40)
Albumin: 4.9 g/dL — ABNORMAL HIGH (ref 3.8–4.8)
Alkaline Phosphatase: 64 [IU]/L (ref 44–121)
BUN/Creatinine Ratio: 15 (ref 12–28)
BUN: 13 mg/dL (ref 8–27)
Bilirubin Total: 0.6 mg/dL (ref 0.0–1.2)
CO2: 20 mmol/L (ref 20–29)
Calcium: 10.1 mg/dL (ref 8.7–10.3)
Chloride: 99 mmol/L (ref 96–106)
Creatinine, Ser: 0.85 mg/dL (ref 0.57–1.00)
Globulin, Total: 2.5 g/dL (ref 1.5–4.5)
Glucose: 113 mg/dL — ABNORMAL HIGH (ref 70–99)
Potassium: 4.1 mmol/L (ref 3.5–5.2)
Sodium: 142 mmol/L (ref 134–144)
Total Protein: 7.4 g/dL (ref 6.0–8.5)
eGFR: 73 mL/min/{1.73_m2} (ref >59)

## 2023-07-09 LAB — ANA 12PLUS PROFILE, DO ALL RDL
Anti-Cardiolipin Ab, IgM (RDL): 19 [MPL'U]/mL (ref <13)
Anti-Chromatin Ab, IgG (RDL): 29 U — ABNORMAL HIGH (ref <20)
Anti-TPO Ab (RDL): <9 [IU]/mL (ref <9.0)
Anti-dsDNA Ab by Farr(RDL): <8 [IU]/mL (ref <8.0)
C3 Complement (RDL): 164 mg/dL (ref 82–167)
C4 Complement (RDL): 22 mg/dL (ref 14–44)

## 2023-07-09 LAB — LIPID PANEL
Chol/HDL Ratio: 2 {ratio} (ref 0.0–4.4)
Cholesterol, Total: 196 mg/dL (ref 100–199)
HDL: 98 mg/dL (ref >39)
LDL Chol Calc (NIH): 83 mg/dL (ref 0–99)
Triglycerides: 82 mg/dL (ref 0–149)
VLDL Cholesterol Cal: 15 mg/dL (ref 5–40)

## 2023-07-09 LAB — IRON,TIBC AND FERRITIN PANEL
Ferritin: 534 ng/mL — ABNORMAL HIGH (ref 15–150)
Iron Saturation: 33 % (ref 15–55)
Iron: 124 ug/dL (ref 27–139)
Total Iron Binding Capacity: 374 ug/dL (ref 250–450)
UIBC: 250 ug/dL (ref 118–369)

## 2023-07-09 LAB — TSH RFX ON ABNORMAL TO FREE T4: TSH: 2.27 u[IU]/mL (ref 0.450–4.500)

## 2023-07-09 LAB — RHEUMATOID FACTOR: Rheumatoid fact SerPl-aCnc: 12.8 [IU]/mL (ref <14.0)

## 2023-07-09 LAB — ANA TITER AND PATTERN: Speckled Pattern: 1:320 {titer} — ABNORMAL HIGH

## 2023-07-09 LAB — C-REACTIVE PROTEIN: CRP: <1 mg/L (ref 0–10)

## 2023-07-16 ENCOUNTER — Encounter: Payer: Self-pay | Admitting: Family Medicine

## 2023-07-16 ENCOUNTER — Encounter: Payer: Self-pay | Admitting: Gastroenterology

## 2023-07-16 DIAGNOSIS — N182 Chronic kidney disease, stage 2 (mild): Secondary | ICD-10-CM

## 2023-07-16 DIAGNOSIS — R7989 Other specified abnormal findings of blood chemistry: Secondary | ICD-10-CM

## 2023-07-16 DIAGNOSIS — R768 Other specified abnormal immunological findings in serum: Secondary | ICD-10-CM

## 2023-07-22 ENCOUNTER — Encounter: Payer: Self-pay | Admitting: Gastroenterology

## 2023-07-23 ENCOUNTER — Ambulatory Visit: Payer: Medicare Other | Admitting: Anesthesiology

## 2023-07-23 ENCOUNTER — Encounter: Payer: Self-pay | Admitting: Gastroenterology

## 2023-07-23 ENCOUNTER — Ambulatory Visit
Admission: RE | Admit: 2023-07-23 | Discharge: 2023-07-23 | Disposition: A | Discharge status: Home / Self Care | Payer: Medicare Other | Attending: Gastroenterology | Admitting: Gastroenterology

## 2023-07-23 ENCOUNTER — Encounter
Admission: RE | Disposition: A | Discharge status: Home / Self Care | Payer: Self-pay | Source: Home / Self Care | Attending: Gastroenterology

## 2023-07-23 DIAGNOSIS — D126 Benign neoplasm of colon, unspecified: Secondary | ICD-10-CM

## 2023-07-23 DIAGNOSIS — D120 Benign neoplasm of cecum: Secondary | ICD-10-CM | POA: Insufficient documentation

## 2023-07-23 DIAGNOSIS — E785 Hyperlipidemia, unspecified: Secondary | ICD-10-CM | POA: Insufficient documentation

## 2023-07-23 DIAGNOSIS — I10 Essential (primary) hypertension: Secondary | ICD-10-CM | POA: Diagnosis not present

## 2023-07-23 DIAGNOSIS — Z1211 Encounter for screening for malignant neoplasm of colon: Secondary | ICD-10-CM | POA: Diagnosis not present

## 2023-07-23 DIAGNOSIS — K5730 Diverticulosis of large intestine without perforation or abscess without bleeding: Secondary | ICD-10-CM | POA: Insufficient documentation

## 2023-07-23 DIAGNOSIS — K219 Gastro-esophageal reflux disease without esophagitis: Secondary | ICD-10-CM | POA: Diagnosis not present

## 2023-07-23 DIAGNOSIS — Z860100 Personal history of colon polyps, unspecified: Secondary | ICD-10-CM | POA: Diagnosis not present

## 2023-07-23 DIAGNOSIS — D125 Benign neoplasm of sigmoid colon: Secondary | ICD-10-CM | POA: Diagnosis not present

## 2023-07-23 DIAGNOSIS — Z79899 Other long term (current) drug therapy: Secondary | ICD-10-CM | POA: Diagnosis not present

## 2023-07-23 DIAGNOSIS — E782 Mixed hyperlipidemia: Secondary | ICD-10-CM | POA: Diagnosis not present

## 2023-07-23 DIAGNOSIS — K635 Polyp of colon: Secondary | ICD-10-CM | POA: Diagnosis not present

## 2023-07-23 HISTORY — DX: Hyperlipidemia, unspecified: E78.5

## 2023-07-23 HISTORY — PX: COLONOSCOPY WITH PROPOFOL: SHX5780

## 2023-07-23 HISTORY — PX: POLYPECTOMY: SHX5525

## 2023-07-23 HISTORY — DX: Gastro-esophageal reflux disease without esophagitis: K21.9

## 2023-07-23 SURGERY — COLONOSCOPY WITH PROPOFOL
Anesthesia: General

## 2023-07-23 MED ORDER — LIDOCAINE HCL (PF) 2 % IJ SOLN
INTRAMUSCULAR | Status: DC | PRN
  Administered 2023-07-23: 40 mg via INTRADERMAL

## 2023-07-23 MED ORDER — PROPOFOL 10 MG/ML IV BOLUS
INTRAVENOUS | Status: DC | PRN
  Administered 2023-07-23 (×2): 30 mg via INTRAVENOUS
  Administered 2023-07-23 (×2): 40 mg via INTRAVENOUS
  Administered 2023-07-23: 30 mg via INTRAVENOUS
  Administered 2023-07-23: 50 mg via INTRAVENOUS

## 2023-07-23 MED ORDER — SODIUM CHLORIDE 0.9 % IV SOLN: INTRAVENOUS | Status: DC

## 2023-07-23 NOTE — Anesthesia Postprocedure Evaluation (Signed)
Anesthesia Post Note  Patient: Melanie Rhodes  Procedure(s) Performed: COLONOSCOPY WITH PROPOFOL POLYPECTOMY  Patient location during evaluation: PACU Anesthesia Type: General Level of consciousness: awake and awake and alert Pain management: satisfactory to patient Vital Signs Assessment: post-procedure vital signs reviewed and stable Respiratory status: spontaneous breathing Cardiovascular status: stable Anesthetic complications: no   No notable events documented.   Last Vitals:  Vitals:   07/23/23 0958 07/23/23 1008  BP: 108/64 135/66  Pulse: 61 84  Resp: 17 (!) 21  Temp: (!) 36.1 C (!) 36.1 C  SpO2: 99% 99%    Last Pain:  Vitals:   07/23/23 1008  TempSrc: Temporal  PainSc: 0-No pain                 VAN STAVEREN,Marbin Olshefski

## 2023-07-23 NOTE — H&P (Signed)
Wyline Mood, MD [address], [place], [place], [address] [address], [place], [place], [address] Phone: [phone]  Fax: [phone]  Primary Care Physician:  Sherlyn Hay, DO   Pre-Procedure History & Physical: HPI:  Melanie Rhodes is a 72 y.o. female is here for an colonoscopy.   Past Medical History:  Diagnosis Date   GERD (gastroesophageal reflux disease)    Hyperlipidemia     Past Surgical History:  Procedure Laterality Date   BREAST BIOPSY Right 02/22/2020   affirm bx, distortion, x marker, benign   BREAST CYST ASPIRATION Left years ago   neg   COLONOSCOPY WITH PROPOFOL N/A 11/09/2017   Procedure: COLONOSCOPY WITH PROPOFOL;  Surgeon: Wyline Mood, MD;  Location: [facility] ENDOSCOPY;  Service: Gastroenterology;  Laterality: N/A;   NO PAST SURGERIES      Prior to Admission medications   Medication Sig Start Date End Date Taking? Authorizing Provider  Fluocinolone Acetonide 0.01 % OIL Apply 1-2 drops to ears nightly as needed for itch and scale. 05/26/23  Yes Willeen Niece, MD  ketoconazole (NIZORAL) 2 % shampoo Massage into scalp daily and let sit several minutes before rinsing. Decrease to 2-3 times weekly for maintenance. 05/26/23  Yes Willeen Niece, MD  omeprazole (PRILOSEC) 20 MG capsule TAKE 1 CAPSULE BY MOUTH EVERY DAY 04/20/23  Yes Ostwalt, Edmon Crape, [name]  rosuvastatin (CRESTOR) 10 MG tablet Take 1 tablet (10 mg total) by mouth daily. 03/31/23  Yes Pardue, Monico Blitz, DO  vitamin B-12 (CYANOCOBALAMIN) 1000 MCG tablet Take 1,000 mcg by mouth daily.   Yes [provider]  Vitamin D, Cholecalciferol, 25 MCG (1000 UT) CAPS Take by mouth daily. Patient takes 2 capsules daily.   Yes [provider]  metroNIDAZOLE (METROCREAM) 0.75 % cream Apply topically to face for rosacea every night; use twice daily for flares. 05/26/23   Willeen Niece, MD  tacrolimus (PROTOPIC) 0.1 % ointment Apply topically to aa face and body qd/bid prn for red  scaly patches 05/26/23   Willeen Niece, MD  traZODone (DESYREL) 100 MG tablet TAKE 0.5-1 TABLETS (50-100 MG TOTAL) BY MOUTH AT BEDTIME. 04/20/23   Ostwalt, Edmon Crape, [name]    Allergies as of 06/26/2023   (No Active Allergies)    Family History  Problem Relation Age of Onset   Hypertension Mother    GER disease Mother    GER disease Father    Hyperlipidemia Father    Diabetes Brother     Social History   Socioeconomic History   Marital status: Married    Spouse name: Not on file   Number of children: 2   Years of education: Not on file   Highest education level: 12th grade  Occupational History   Occupation: retired  Tobacco Use   Smoking status: Never   Smokeless tobacco: Never  Vaping Use   Vaping status: Never Used  Substance and Sexual Activity   Alcohol use: Yes    Alcohol/week: 7.0 standard drinks of alcohol    Types: 7 Glasses of wine per week    Comment: 1 glass of wine every day   Drug use: No   Sexual activity: Not on file  Other Topics Concern   Not on file  Social History Narrative   Not on file   Social Determinants of Health   Financial Resource Strain: Low Risk  (06/23/2023)   Overall Financial Resource Strain (CARDIA)    Difficulty of Paying Living Expenses: Not hard at all  Food Insecurity: No Food Insecurity (06/23/2023)   Hunger Vital Sign    Worried About Running Out of Food in the Last Year: Never true    Ran Out of Food in the Last Year: Never true  Transportation Needs: No Transportation Needs (06/23/2023)   PRAPARE - Administrator, Civil Service (Medical): No    Lack of Transportation (Non-Medical): No  Physical Activity: Sufficiently Active (06/23/2023)   Exercise Vital Sign    Days of Exercise per Week: 6 days    Minutes of Exercise per Session: 60 min  Stress: Stress Concern Present (06/23/2023)   [employer] of Occupational Health - Occupational Stress Questionnaire    Feeling of Stress : To some extent  Social  Connections: Moderately Isolated (06/23/2023)   Social Connection and Isolation Panel [NHANES]    Frequency of Communication with Friends and Family: More than three times a week    Frequency of Social Gatherings with Friends and Family: Three times a week    Attends Religious Services: Never    Active Member of Clubs or Organizations: No    Attends Banker Meetings: Not on file    Marital Status: Married  Catering manager Violence: Not At Risk (10/29/2020)   Humiliation, Afraid, Rape, and Kick questionnaire    Fear of Current or Ex-Partner: No    Emotionally Abused: No    Physically Abused: No    Sexually Abused: No    Review of Systems: See HPI, otherwise negative ROS  Physical Exam: BP (!) 174/87   Pulse 75   Temp (!) 96.9 F (36.1 C) (Temporal)   Resp 16   Ht 5\' 2"  (1.575 m)   Wt 60.3 kg   SpO2 99%   BMI 24.33 kg/m  General:   Alert,  pleasant and cooperative in NAD Head:  Normocephalic and atraumatic. Neck:  Supple; no masses or thyromegaly. Lungs:  Clear throughout to auscultation, normal respiratory effort.    Heart:  +S1, +S2, Regular rate and rhythm, No edema. Abdomen:  Soft, nontender and nondistended. Normal bowel sounds, without guarding, and without rebound.   Neurologic:  Alert and  oriented x4;  grossly normal neurologically.  Impression/Plan: Melanie Rhodes is here for an colonoscopy to be performed for Screening colonoscopy average risk   Risks, benefits, limitations, and alternatives regarding  colonoscopy have been reviewed with the patient.  Questions have been answered.  All parties agreeable.   Wyline Mood, MD  07/23/2023, 9:19 AM

## 2023-07-23 NOTE — Transfer of Care (Signed)
Immediate Anesthesia Transfer of Care Note  Patient: Melanie Rhodes  Procedure(s) Performed: COLONOSCOPY WITH PROPOFOL POLYPECTOMY  Patient Location: PACU and Endoscopy Unit  Anesthesia Type:MAC  Level of Consciousness: drowsy  Airway & Oxygen Therapy: Patient Spontanous Breathing and Patient connected to nasal cannula oxygen  Post-op Assessment: Report given to RN and Post -op Vital signs reviewed and stable  Post vital signs: Reviewed and stable  Last Vitals:  Vitals Value Taken Time  BP 99/48 07/23/23 0950  Temp    Pulse 73 07/23/23 0950  Resp 22 07/23/23 0950  SpO2 97 % 07/23/23 0950  Vitals shown include unfiled device data.  Last Pain:  Vitals:   07/23/23 0830  TempSrc: Temporal  PainSc: 0-No pain         Complications: No notable events documented.

## 2023-07-23 NOTE — Anesthesia Preprocedure Evaluation (Signed)
Anesthesia Evaluation  Patient identified by MRN, date of birth, ID band Patient awake    Reviewed: Allergy & Precautions, NPO status , Patient's Chart, lab work & pertinent test results  Airway Mallampati: II  TM Distance: >3 FB Neck ROM: Full    Dental  (+) Teeth Intact   Pulmonary neg pulmonary ROS   Pulmonary exam normal breath sounds clear to auscultation       Cardiovascular Exercise Tolerance: Good hypertension, Pt. on medications negative cardio ROS Normal cardiovascular exam Rhythm:Regular Rate:Normal     Neuro/Psych negative neurological ROS  negative psych ROS   GI/Hepatic negative GI ROS, Neg liver ROS,GERD  ,,  Endo/Other  negative endocrine ROS    Renal/GU negative Renal ROS  negative genitourinary   Musculoskeletal  (+)  Fibromyalgia -  Abdominal Normal abdominal exam  (+)   Peds negative pediatric ROS (+)  Hematology negative hematology ROS (+)   Anesthesia Other Findings Past Medical History: No date: GERD (gastroesophageal reflux disease) No date: Hyperlipidemia  Past Surgical History: 02/22/2020: BREAST BIOPSY; Right     Comment:  affirm bx, distortion, x marker, benign years ago: BREAST CYST ASPIRATION; Left     Comment:  neg 11/09/2017: COLONOSCOPY WITH PROPOFOL; N/A     Comment:  Procedure: COLONOSCOPY WITH PROPOFOL;  Surgeon: Wyline Mood, MD;  Location: [facility] ENDOSCOPY;  Service:               Gastroenterology;  Laterality: N/A; No date: NO PAST SURGERIES  BMI    Body Mass Index: 24.33 kg/m      Reproductive/Obstetrics negative OB ROS                             Anesthesia Physical Anesthesia Plan  ASA: 2  Anesthesia Plan: General   Post-op Pain Management:    Induction: Intravenous  PONV Risk Score and Plan: Propofol infusion and TIVA  Airway Management Planned: Natural Airway and Nasal Cannula  Additional Equipment:    Intra-op Plan:   Post-operative Plan:   Informed Consent: I have reviewed the patients History and Physical, chart, labs and discussed the procedure including the risks, benefits and alternatives for the proposed anesthesia with the patient or authorized representative who has indicated his/her understanding and acceptance.     Dental Advisory Given  Plan Discussed with: CRNA and Surgeon  Anesthesia Plan Comments:        Anesthesia Quick Evaluation

## 2023-07-23 NOTE — Op Note (Signed)
[facility] Gastroenterology Patient Name: Aparna Aispuro Procedure Date: 07/23/2023 9:26 AM MRN: [id number] Account #: [id number] Date of Birth: 07/20/1951 Admit Type: Outpatient Age: 72 Room: [facility] ENDO ROOM 2 Gender: Female Note Status: Finalized Instrument Name: Prentice Docker [id number] Procedure:             Colonoscopy Indications:           Screening for colorectal malignant neoplasm Providers:             Wyline Mood MD, MD Referring MD:          Viviano Simas (Referring MD) Medicines:             Monitored Anesthesia Care Complications:         No immediate complications. Procedure:             Pre-Anesthesia Assessment:                        - Prior to the procedure, a History and Physical was                         performed, and patient medications, allergies and                         sensitivities were reviewed. The patient's tolerance                         of previous anesthesia was reviewed.                        - The risks and benefits of the procedure and the                         sedation options and risks were discussed with the                         patient. All questions were answered and informed                         consent was obtained.                        - ASA Grade Assessment: II - A patient with mild                         systemic disease.                        After obtaining informed consent, the colonoscope was                         passed under direct vision. Throughout the procedure,                         the patient's blood pressure, pulse, and oxygen                         saturations were monitored continuously. The                         Colonoscope was introduced through  the anus and                         advanced to the the cecum, identified by the                         appendiceal orifice. The colonoscopy was performed                         with ease. The patient tolerated the procedure well.                          The quality of the bowel preparation was excellent.                         The ileocecal valve, appendiceal orifice, and rectum                         were photographed. Findings:      The perianal and digital rectal examinations were normal.      A 5 mm polyp was found in the cecum. The polyp was sessile. The polyp       was removed with a cold snare. Resection and retrieval were complete.      A 5 mm polyp was found in the ascending colon. The polyp was sessile.       The polyp was removed with a cold snare. Resection was complete, but the       polyp tissue was not retrieved.      A 5 mm polyp was found in the sigmoid colon. The polyp was sessile. The       polyp was removed with a cold snare. Resection and retrieval were       complete.      A 3 mm polyp was found in the sigmoid colon. The polyp was sessile. The       polyp was removed with a cold biopsy forceps. Resection and retrieval       were complete.      Multiple medium-mouthed diverticula were found in the sigmoid colon.      The exam was otherwise without abnormality on direct and retroflexion       views. Impression:            - One 5 mm polyp in the cecum, removed with a cold                         snare. Resected and retrieved.                        - One 5 mm polyp in the ascending colon, removed with                         a cold snare. Complete resection. Polyp tissue not                         retrieved.                        - One 5 mm polyp in the sigmoid colon, removed with a  cold snare. Resected and retrieved.                        - One 3 mm polyp in the sigmoid colon, removed with a                         cold biopsy forceps. Resected and retrieved.                        - Diverticulosis in the sigmoid colon.                        - The examination was otherwise normal on direct and                         retroflexion views. Recommendation:        -  Discharge patient to home (with escort).                        - Resume previous diet.                        - Continue present medications.                        - Await pathology results.                        - Repeat colonoscopy in 3 years for surveillance. Procedure Code(s):     --- Professional ---                        [phone], Colonoscopy, flexible; with removal of                         tumor(s), polyp(s), or other lesion(s) by snare                         technique                        45380, 59, Colonoscopy, flexible; with biopsy, single                         or multiple Diagnosis Code(s):     --- Professional ---                        Z12.11, Encounter for screening for malignant neoplasm                         of colon                        D12.0, Benign neoplasm of cecum                        D12.2, Benign neoplasm of ascending colon                        D12.5, Benign neoplasm of sigmoid colon  K57.30, Diverticulosis of large intestine without                         perforation or abscess without bleeding CPT copyright 2022 American Medical Association. All rights reserved. The codes documented in this report are preliminary and upon coder review may  be revised to meet current compliance requirements. Wyline Mood, MD Wyline Mood MD, MD 07/23/2023 9:50:34 AM This report has been signed electronically. Number of Addenda: 0 Note Initiated On: 07/23/2023 9:26 AM Scope Withdrawal Time: 0 hours 9 minutes 49 seconds  Total Procedure Duration: 0 hours 14 minutes 27 seconds  Estimated Blood Loss:  Estimated blood loss: none.      [facility]

## 2023-07-24 ENCOUNTER — Encounter: Payer: Self-pay | Admitting: Gastroenterology

## 2023-07-24 LAB — SURGICAL PATHOLOGY

## 2023-07-30 ENCOUNTER — Encounter: Payer: Self-pay | Admitting: Gastroenterology

## 2023-09-21 DIAGNOSIS — R7989 Other specified abnormal findings of blood chemistry: Secondary | ICD-10-CM | POA: Diagnosis not present

## 2023-09-21 DIAGNOSIS — R768 Other specified abnormal immunological findings in serum: Secondary | ICD-10-CM | POA: Diagnosis not present

## 2023-09-21 DIAGNOSIS — M3500 Sicca syndrome, unspecified: Secondary | ICD-10-CM | POA: Diagnosis not present

## 2023-09-25 ENCOUNTER — Ambulatory Visit: Payer: Medicare Other | Admitting: Family Medicine

## 2023-09-25 VITALS — BP 167/77 | HR 67 | Resp 18 | Ht 62.0 in | Wt 130.4 lb

## 2023-09-25 DIAGNOSIS — E782 Mixed hyperlipidemia: Secondary | ICD-10-CM

## 2023-09-25 DIAGNOSIS — R7989 Other specified abnormal findings of blood chemistry: Secondary | ICD-10-CM

## 2023-09-25 DIAGNOSIS — M797 Fibromyalgia: Secondary | ICD-10-CM | POA: Diagnosis not present

## 2023-09-25 DIAGNOSIS — I10 Essential (primary) hypertension: Secondary | ICD-10-CM

## 2023-09-25 DIAGNOSIS — L719 Rosacea, unspecified: Secondary | ICD-10-CM

## 2023-09-25 DIAGNOSIS — M3500 Sicca syndrome, unspecified: Secondary | ICD-10-CM | POA: Diagnosis not present

## 2023-09-25 MED ORDER — LISINOPRIL 5 MG PO TABS: 5.0000 mg | ORAL_TABLET | Freq: Every day | ORAL | 0 refills | Status: DC

## 2023-09-25 NOTE — Progress Notes (Signed)
Established patient visit   Patient: Melanie Rhodes   DOB: 1950/10/25   73 y.o. Female  MRN: [id number] Visit Date: 09/25/2023  Today's healthcare provider: Sherlyn Hay, DO   Chief Complaint  Patient presents with   Follow-up    Ferritin recheck   Subjective    HPI Last mAWV: 11/18/2022   The patient, with a history of fibromyalgia and hyperlipidemia, presented for a follow-up visit primarily concerning elevated ferritin levels. The patient reported a recent consultation with a rheumatologist, during which she tested positive for lupus. However, the rheumatologist did not confirm a lupus diagnosis, attributing the patient's facial rash to rosacea instead. The patient expressed confusion and frustration over these conflicting results, as she has experienced symptoms such as dry mouth, dry skin, and sensitivity to touch, which potentially align with both fibromyalgia and lupus.  The patient also reported a history of restless legs, initially thought to be due to anemia, which led to the discovery of high ferritin levels. The patient has made lifestyle changes, including cutting out sugar and alcohol, which has resulted in weight loss. The patient expressed concern about the potential implications of high ferritin levels, including the possibility of malignancy or alcohol-related issues.  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 The rash, initially thought to be a lupus symptom, became itchy, dry, and bumpy, spreading across the forehead. The patient has been managing the rash with various creams and ointments, including a rosacea cream prescribed by a dermatologist.  The patient has been proactive in managing her health, including regular walking for exercise and keeping up-to-date with vaccinations. She recently received the first dose of the shingles vaccine and is planning to get the second  dose in the coming months. The patient expressed a desire for clarity and understanding of her health conditions, particularly concerning the elevated ferritin levels and potential lupus diagnosis.     Medications: Outpatient Medications Prior to Visit  Medication Sig   Fluocinolone Acetonide 0.01 % OIL Apply 1-2 drops to ears nightly as needed for itch and scale.   ketoconazole (NIZORAL) 2 % shampoo Massage into scalp daily and let sit several minutes before rinsing. Decrease to 2-3 times weekly for maintenance.   metroNIDAZOLE (METROCREAM) 0.75 % cream Apply topically to face for rosacea every night; use twice daily for flares.   omeprazole (PRILOSEC) 20 MG capsule TAKE 1 CAPSULE BY MOUTH EVERY DAY   tacrolimus (PROTOPIC) 0.1 % ointment Apply topically to aa face and body qd/bid prn for red scaly patches   traZODone (DESYREL) 100 MG tablet TAKE 0.5-1 TABLETS (50-100 MG TOTAL) BY MOUTH AT BEDTIME.   vitamin B-12 (CYANOCOBALAMIN) 1000 MCG tablet Take 1,000 mcg by mouth daily.   Vitamin D, Cholecalciferol, 25 MCG (1000 UT) CAPS Take by mouth daily. Patient takes 2 capsules daily.   [DISCONTINUED] rosuvastatin (CRESTOR) 10 MG tablet Take 1 tablet (10 mg total) by mouth daily.   No facility-administered medications prior to visit.        Objective    BP (!) 167/77 (BP Location: Left Arm, Patient Position: Sitting, Cuff Size: Normal)   Pulse 67   Resp 18   Ht 5\' 2"  (1.575 m)   Wt 130 lb 6.4 oz (59.1 kg)   SpO2 100%   BMI 23.85 kg/m     Physical Exam Vitals and nursing note reviewed.  Constitutional:  General: She is not in acute distress.    Appearance: Normal appearance.  HENT:     Head: Normocephalic and atraumatic.  Eyes:     General: No scleral icterus.    Conjunctiva/sclera: Conjunctivae normal.  Cardiovascular:     Rate and Rhythm: Normal rate.  Pulmonary:     Effort: Pulmonary effort is normal.  Neurological:     Mental Status: She is alert and oriented to  person, place, and time. Mental status is at baseline.  Psychiatric:        Mood and Affect: Mood normal.        Behavior: Behavior normal.      Results for orders placed or performed in visit on 09/25/23  Iron, TIBC and Ferritin Panel  Result Value Ref Range   Total Iron Binding Capacity 360 250 - 450 ug/dL   UIBC 413 244 - 010 ug/dL   Iron 93 27 - 272 ug/dL   Iron Saturation 26 15 - 55 %   Ferritin 696 (H) 15 - 150 ng/mL    Assessment & Plan    High serum ferritin Assessment & Plan: Elevated ferritin levels noted. Previously tested negative for hemochromatosis. Liver ultrasound was normal. Discussed potential causes including chronic inflammatory conditions and alcohol use. Patient has abstained from alcohol for nearly three months.  - Recheck ferritin levels - Recommend follow up with hematology/oncology again if ferritin remains elevated  Orders: -     Iron, TIBC and Ferritin Panel  Essential hypertension Assessment & Plan: Blood pressure remains elevated. Due to concern for white coat hypertension, will start lisinopril 5 mg daily and monitor for s/sx hypotension.  Orders: -     Lisinopril; Take 1 tablet (5 mg total) by mouth daily.  Dispense: 90 tablet; Refill: 0  Rosacea Assessment & Plan: Facial rash that is itchy, dry, and bumpy, previously diagnosed as rosacea by a dermatologist. Rash worsened over three months but improved with rosacea cream. Discussed the importance of using non-comedogenic sunblock and avoiding sun exposure to manage symptoms. - Continue using rosacea cream - Use non-comedogenic sunblock - Avoid sun exposure   Fibromyalgia Assessment & Plan: Reports symptoms consistent with fibromyalgia, including sensitivity to touch. Rheumatologist uncertain about diagnosis. Manages symptoms with exercise. Discussed that fibromyalgia is a recognized condition with specific symptoms, despite the lack of definitive tests. - Continue daily walking exercise -  Monitor symptoms and follow up if condition worsens   Mixed hyperlipidemia Assessment & Plan: On Crestor for cholesterol management. Recent lipid panel indicated good control of hyperlipidemia. - Continue Crestor 10 mg daily as prescribed   Sicca syndrome (HCC) Assessment & Plan: Tested positive for lupus antibodies (antichromatin mildly elevated at 29). Previous tests for Sjogren's syndrome were negative. Reports dry mouth, dry eyes, and a history of lichen planus. Rheumatologist does not believe lupus is present despite positive test results. Explained that some individuals can test positive for lupus antibodies without having the disease. - Follow up with rheumatologist in six months unless new symptoms develop   General Health Maintenance Received the first shingles vaccine and plans to get the second dose. Needs Tdap and pneumonia vaccines. Declined COVID booster. Discussed the timing of the second shingles vaccine to minimize side effects. - Obtain second shingles vaccine, Tdap vaccine and pneumonia vaccine - Provide proof of vaccination records from pharmacy  Follow-up - Follow up with rheumatologist in six months. Return in about 3 months (around 12/24/2023) for w/AWV nurse for mAWV; in 6 months with PCP  for chronic f/u.      I discussed the assessment and treatment plan with the patient  The patient was provided an opportunity to ask questions and all were answered. The patient agreed with the plan and demonstrated an understanding of the instructions.   The patient was advised to call back or seek an in-person evaluation if the symptoms worsen or if the condition fails to improve as anticipated.    Sherlyn Hay, DO  [facility] [phone] (phone) [phone] (fax)  [facility]

## 2023-09-25 NOTE — Patient Instructions (Addendum)
Use non-comedogenic sunblock.

## 2023-09-26 LAB — IRON,TIBC AND FERRITIN PANEL
Ferritin: 696 ng/mL — ABNORMAL HIGH (ref 15–150)
Iron Saturation: 26 % (ref 15–55)
Iron: 93 ug/dL (ref 27–139)
Total Iron Binding Capacity: 360 ug/dL (ref 250–450)
UIBC: 267 ug/dL (ref 118–369)

## 2023-09-30 ENCOUNTER — Other Ambulatory Visit: Payer: Self-pay | Admitting: Family Medicine

## 2023-09-30 DIAGNOSIS — E782 Mixed hyperlipidemia: Secondary | ICD-10-CM

## 2023-10-02 ENCOUNTER — Encounter: Payer: Self-pay | Admitting: Family Medicine

## 2023-10-02 DIAGNOSIS — R7989 Other specified abnormal findings of blood chemistry: Secondary | ICD-10-CM

## 2023-10-04 ENCOUNTER — Encounter: Payer: Self-pay | Admitting: Family Medicine

## 2023-10-04 DIAGNOSIS — I10 Essential (primary) hypertension: Secondary | ICD-10-CM | POA: Insufficient documentation

## 2023-10-04 DIAGNOSIS — M3500 Sicca syndrome, unspecified: Secondary | ICD-10-CM | POA: Insufficient documentation

## 2023-10-04 NOTE — Assessment & Plan Note (Signed)
Reports symptoms consistent with fibromyalgia, including sensitivity to touch. Rheumatologist uncertain about diagnosis. Manages symptoms with exercise. Discussed that fibromyalgia is a recognized condition with specific symptoms, despite the lack of definitive tests. - Continue daily walking exercise - Monitor symptoms and follow up if condition worsens

## 2023-10-04 NOTE — Assessment & Plan Note (Signed)
Tested positive for lupus antibodies (antichromatin mildly elevated at 29). Previous tests for Sjogren's syndrome were negative. Reports dry mouth, dry eyes, and a history of lichen planus. Rheumatologist does not believe lupus is present despite positive test results. Explained that some individuals can test positive for lupus antibodies without having the disease. - Follow up with rheumatologist in six months unless new symptoms develop

## 2023-10-04 NOTE — Assessment & Plan Note (Signed)
Blood pressure remains elevated. Due to concern for white coat hypertension, will start lisinopril 5 mg daily and monitor for s/sx hypotension.

## 2023-10-04 NOTE — Assessment & Plan Note (Signed)
On Crestor for cholesterol management. Recent lipid panel indicated good control of hyperlipidemia. - Continue Crestor 10 mg daily as prescribed

## 2023-10-04 NOTE — Assessment & Plan Note (Signed)
Facial rash that is itchy, dry, and bumpy, previously diagnosed as rosacea by a dermatologist. Rash worsened over three months but improved with rosacea cream. Discussed the importance of using non-comedogenic sunblock and avoiding sun exposure to manage symptoms. - Continue using rosacea cream - Use non-comedogenic sunblock - Avoid sun exposure

## 2023-10-04 NOTE — Assessment & Plan Note (Signed)
Elevated ferritin levels noted. Previously tested negative for hemochromatosis. Liver ultrasound was normal. Discussed potential causes including chronic inflammatory conditions and alcohol use. Patient has abstained from alcohol for nearly three months.  - Recheck ferritin levels - Recommend follow up with hematology/oncology again if ferritin remains elevated

## 2023-10-10 ENCOUNTER — Other Ambulatory Visit: Payer: Self-pay | Admitting: Physician Assistant

## 2023-10-10 DIAGNOSIS — G4700 Insomnia, unspecified: Secondary | ICD-10-CM

## 2023-10-11 ENCOUNTER — Other Ambulatory Visit: Payer: Self-pay | Admitting: Physician Assistant

## 2023-10-11 DIAGNOSIS — R12 Heartburn: Secondary | ICD-10-CM

## 2023-10-12 NOTE — Telephone Encounter (Signed)
Requested Prescriptions  Pending Prescriptions Disp Refills   traZODone (DESYREL) 100 MG tablet [Pharmacy Med Name: TRAZODONE 100 MG TABLET] 90 tablet 0    Sig: TAKE 0.5-1 TABLETS (50-100 MG TOTAL) BY MOUTH AT BEDTIME.     Psychiatry: Antidepressants - Serotonin Modulator Passed - 10/12/2023  1:37 PM      Passed - Valid encounter within last 6 months    Recent Outpatient Visits           2 weeks ago High serum ferritin   [facility] [place], Monico Blitz, DO   3 months ago Mixed hyperlipidemia   [facility] [place], Monico Blitz, DO   10 months ago Medicare annual wellness visit, subsequent   [facility] Alfredia Ferguson, [name]   1 year ago Mixed hyperlipidemia   [facility] Alfredia Ferguson, [name]   1 year ago Medicare annual wellness visit, subsequent   [facility] Alfredia Ferguson, [place]

## 2023-10-13 NOTE — Telephone Encounter (Signed)
Requested Prescriptions  Pending Prescriptions Disp Refills   omeprazole (PRILOSEC) 20 MG capsule [Pharmacy Med Name: OMEPRAZOLE DR 20 MG CAPSULE] 90 capsule 0    Sig: TAKE 1 CAPSULE BY MOUTH EVERY DAY     Gastroenterology: Proton Pump Inhibitors Passed - 10/13/2023  8:39 AM      Passed - Valid encounter within last 12 months    Recent Outpatient Visits           2 weeks ago High serum ferritin   [facility] [place], Monico Blitz, DO   3 months ago Mixed hyperlipidemia   [facility] [place], Monico Blitz, DO   10 months ago Medicare annual wellness visit, subsequent   [facility] Alfredia Ferguson, [name]   1 year ago Mixed hyperlipidemia   [facility] Alfredia Ferguson, [name]   1 year ago Medicare annual wellness visit, subsequent   [facility] Alfredia Ferguson, [place]

## 2023-11-12 DIAGNOSIS — H2513 Age-related nuclear cataract, bilateral: Secondary | ICD-10-CM | POA: Diagnosis not present

## 2023-11-12 DIAGNOSIS — H43393 Other vitreous opacities, bilateral: Secondary | ICD-10-CM | POA: Diagnosis not present

## 2023-11-12 DIAGNOSIS — H04123 Dry eye syndrome of bilateral lacrimal glands: Secondary | ICD-10-CM | POA: Diagnosis not present

## 2023-11-13 ENCOUNTER — Inpatient Hospital Stay: Payer: Medicare Other | Attending: Oncology | Admitting: Oncology

## 2023-11-13 ENCOUNTER — Encounter: Payer: Self-pay | Admitting: Oncology

## 2023-11-13 ENCOUNTER — Inpatient Hospital Stay: Payer: Medicare Other

## 2023-11-13 VITALS — BP 162/88 | HR 73 | Temp 94.9°F | Resp 18 | Wt 129.9 lb

## 2023-11-13 DIAGNOSIS — Z7289 Other problems related to lifestyle: Secondary | ICD-10-CM | POA: Insufficient documentation

## 2023-11-13 DIAGNOSIS — R7989 Other specified abnormal findings of blood chemistry: Secondary | ICD-10-CM | POA: Diagnosis not present

## 2023-11-13 DIAGNOSIS — Z83438 Family history of other disorder of lipoprotein metabolism and other lipidemia: Secondary | ICD-10-CM | POA: Diagnosis not present

## 2023-11-13 DIAGNOSIS — Z833 Family history of diabetes mellitus: Secondary | ICD-10-CM | POA: Diagnosis not present

## 2023-11-13 DIAGNOSIS — Z79899 Other long term (current) drug therapy: Secondary | ICD-10-CM | POA: Insufficient documentation

## 2023-11-13 DIAGNOSIS — Z8249 Family history of ischemic heart disease and other diseases of the circulatory system: Secondary | ICD-10-CM | POA: Diagnosis not present

## 2023-11-13 DIAGNOSIS — E785 Hyperlipidemia, unspecified: Secondary | ICD-10-CM | POA: Diagnosis not present

## 2023-11-13 NOTE — Progress Notes (Signed)
 Patient as no new/acute concerns today.

## 2023-11-23 NOTE — Progress Notes (Signed)
 Hematology/Oncology Consult note [facility]  [phone] [phone]  Patient Care Team: Sherlyn Hay, DO as PCP - General (Family Medicine) Willeen Niece, MD (Dermatology) System, Provider Not In Creig Hines, MD as Consulting Physician (Oncology)   Name of the patient: Melanie Rhodes  [id number]  1951/04/02   Date of visit: 11/23/23  Diagnosis-elevated ferritin of unclear etiology  Chief complaint/ Reason for visit-transfer of care for elevated ferritin  Heme/Onc history: Patient is a 73 year old female with a past medical history significant for hypertension and hyperlipidemia.  She was evaluated by Dr. Cathie Hoops and was last seen by her in April 2024 for elevated ferritin.  She underwent hemochromatosis testing at that time which was negative.  Liver functions have been normal.  At that time she was drinking alcohol few times a week and was thought to be one of the reasons for elevated ferritin.  Patient states that she has not had any alcohol since October 2024.  She has undergone ultrasound abdomen in the past which did not show any evidence of fatty liver or cirrhosis.  Despite that her ferritin levels have remained elevated and therefore patient has been referred back.  Ferritin levels over the last 2 years have been fluctuating between 500s to 600s without a clear rising trend.  Iron saturation has remained between 20 to 40%.  She was found to be ANA positive but does not have any known diagnosis of rheumatological disorder.  She was evaluated by rheumatology in January 2025 as well.  Interval history- Patient presently Feels well and denies any changes in her appetite or weight.  She has not consumed any alcohol since October 2024.  She has occasional symptoms of restless leg syndrome which is what prompted ferritin checked in the past.  ECOG PS- 0 Pain scale- 0   Review of systems- Review of Systems  Constitutional:  Negative for  chills, fever, malaise/fatigue and weight loss.  HENT:  Negative for congestion, ear discharge and nosebleeds.   Eyes:  Negative for blurred vision.  Respiratory:  Negative for cough, hemoptysis, sputum production, shortness of breath and wheezing.   Cardiovascular:  Negative for chest pain, palpitations, orthopnea and claudication.  Gastrointestinal:  Negative for abdominal pain, blood in stool, constipation, diarrhea, heartburn, melena, nausea and vomiting.  Genitourinary:  Negative for dysuria, flank pain, frequency, hematuria and urgency.  Musculoskeletal:  Negative for back pain, joint pain and myalgias.  Skin:  Negative for rash.  Neurological:  Negative for dizziness, tingling, focal weakness, seizures, weakness and headaches.  Endo/Heme/Allergies:  Does not bruise/bleed easily.  Psychiatric/Behavioral:  Negative for depression and suicidal ideas. The patient does not have insomnia.       No Known Allergies   Past Medical History:  Diagnosis Date   GERD (gastroesophageal reflux disease)    Hyperlipidemia      Past Surgical History:  Procedure Laterality Date   BREAST BIOPSY Right 02/22/2020   affirm bx, distortion, x marker, benign   BREAST CYST ASPIRATION Left years ago   neg   COLONOSCOPY WITH PROPOFOL N/A 11/09/2017   Procedure: COLONOSCOPY WITH PROPOFOL;  Surgeon: Wyline Mood, MD;  Location: [facility] ENDOSCOPY;  Service: Gastroenterology;  Laterality: N/A;   COLONOSCOPY WITH PROPOFOL N/A 07/23/2023   Procedure: COLONOSCOPY WITH PROPOFOL;  Surgeon: Wyline Mood, MD;  Location: [facility] ENDOSCOPY;  Service: Gastroenterology;  Laterality: N/A;   NO PAST SURGERIES     POLYPECTOMY  07/23/2023   Procedure: POLYPECTOMY;  Surgeon: Tobi Bastos,  Sharlet Salina, MD;  Location: [facility] ENDOSCOPY;  Service: Gastroenterology;;    Social History   Socioeconomic History   Marital status: Married    Spouse name: Not on file   Number of children: 2   Years of education: Not on file   Highest education  level: 12th grade  Occupational History   Occupation: retired  Tobacco Use   Smoking status: Never   Smokeless tobacco: Never  Vaping Use   Vaping status: Never Used  Substance and Sexual Activity   Alcohol use: Yes    Alcohol/week: 7.0 standard drinks of alcohol    Types: 7 Glasses of wine per week    Comment: 1 glass of wine every day   Drug use: No   Sexual activity: Not on file  Other Topics Concern   Not on file  Social History Narrative   Not on file   Social Drivers of Health   Financial Resource Strain: Low Risk  (09/21/2023)   Received from [facility]   Overall Financial Resource Strain (CARDIA)    Difficulty of Paying Living Expenses: Not very hard  Food Insecurity: No Food Insecurity (09/21/2023)   Hunger Vital Sign    Worried About Running Out of Food in the Last Year: Never true    Ran Out of Food in the Last Year: Never true  Transportation Needs: No Transportation Needs (09/21/2023)   PRAPARE - Administrator, Civil Service (Medical): No    Lack of Transportation (Non-Medical): No  Physical Activity: Sufficiently Active (09/21/2023)   Exercise Vital Sign    Days of Exercise per Week: 5 days    Minutes of Exercise per Session: 40 min  Stress: Stress Concern Present (09/21/2023)   [employer] of Occupational Health - Occupational Stress Questionnaire    Feeling of Stress : To some extent  Social Connections: Moderately Isolated (09/21/2023)   Social Connection and Isolation Panel [NHANES]    Frequency of Communication with Friends and Family: More than three times a week    Frequency of Social Gatherings with Friends and Family: Twice a week    Attends Religious Services: Never    Database administrator or Organizations: No    Attends Engineer, structural: Not on file    Marital Status: Married  Catering manager Violence: Not At Risk (10/29/2020)   Humiliation, Afraid, Rape, and Kick questionnaire    Fear of Current  or Ex-Partner: No    Emotionally Abused: No    Physically Abused: No    Sexually Abused: No    Family History  Problem Relation Age of Onset   Hypertension Mother    GER disease Mother    GER disease Father    Hyperlipidemia Father    Diabetes Brother      Current Outpatient Medications:    Fluocinolone Acetonide 0.01 % OIL, Apply 1-2 drops to ears nightly as needed for itch and scale., Disp: 20 mL, Rfl: 5   ketoconazole (NIZORAL) 2 % shampoo, Massage into scalp daily and let sit several minutes before rinsing. Decrease to 2-3 times weekly for maintenance., Disp: 120 mL, Rfl: 11   lisinopril (ZESTRIL) 5 MG tablet, Take 1 tablet (5 mg total) by mouth daily., Disp: 90 tablet, Rfl: 0   metroNIDAZOLE (METROCREAM) 0.75 % cream, Apply topically to face for rosacea every night; use twice daily for flares., Disp: 45 g, Rfl: 11   omeprazole (PRILOSEC) 20 MG capsule, TAKE 1 CAPSULE BY MOUTH EVERY DAY,  Disp: 90 capsule, Rfl: 0   rosuvastatin (CRESTOR) 10 MG tablet, TAKE 1 TABLET BY MOUTH EVERY DAY, Disp: 90 tablet, Rfl: 1   tacrolimus (PROTOPIC) 0.1 % ointment, Apply topically to aa face and body qd/bid prn for red scaly patches, Disp: 30 g, Rfl: 3   traZODone (DESYREL) 100 MG tablet, TAKE 0.5-1 TABLETS (50-100 MG TOTAL) BY MOUTH AT BEDTIME., Disp: 90 tablet, Rfl: 0   vitamin B-12 (CYANOCOBALAMIN) 1000 MCG tablet, Take 1,000 mcg by mouth daily., Disp: , Rfl:    Vitamin D, Cholecalciferol, 25 MCG (1000 UT) CAPS, Take by mouth daily. Patient takes 2 capsules daily., Disp: , Rfl:   Physical exam:  Vitals:   11/13/23 1019  BP: (!) 162/88  Pulse: 73  Resp: 18  Temp: (!) 94.9 F (34.9 C)  TempSrc: Tympanic  SpO2: 100%  Weight: 129 lb 14.4 oz (58.9 kg)   Physical Exam Cardiovascular:     Rate and Rhythm: Normal rate and regular rhythm.     Heart sounds: Normal heart sounds.  Pulmonary:     Effort: Pulmonary effort is normal.     Breath sounds: Normal breath sounds.  Abdominal:      General: Bowel sounds are normal.     Palpations: Abdomen is soft.  Lymphadenopathy:     Comments: No palpable cervical adenopathy  Skin:    General: Skin is warm and dry.  Neurological:     Mental Status: She is alert and oriented to person, place, and time.         Latest Ref Rng & Units 06/25/2023    8:47 AM  CMP  Glucose 70 - 99 mg/dL 536   BUN 8 - 27 mg/dL 13   Creatinine 6.44 - 1.00 mg/dL 0.34   Sodium 742 - 595 mmol/L 142   Potassium 3.5 - 5.2 mmol/L 4.1   Chloride 96 - 106 mmol/L 99   CO2 20 - 29 mmol/L 20   Calcium 8.7 - 10.3 mg/dL 63.8   Total Protein 6.0 - 8.5 g/dL 7.4   Total Bilirubin 0.0 - 1.2 mg/dL 0.6   Alkaline Phos 44 - 121 IU/L 64   AST 0 - 40 IU/L 29   ALT 0 - 32 IU/L 29       Latest Ref Rng & Units 11/18/2022   11:28 AM  CBC  WBC 3.4 - 10.8 x10E3/uL 5.2   Hemoglobin 11.1 - 15.9 g/dL 75.6   Hematocrit 43.3 - 46.6 % 47.2   Platelets 150 - 450 x10E3/uL 201     No images are attached to the encounter.  No results found.   Assessment and plan- Patient is a 73 y.o. female referred for elevated ferritin  Ferritin can be increased in conditions such as iron overload which the patient does not have as she was already tested for hereditary hemochromatosis.  It can also be seen in chronic liver disease or chronic disease as such but patient does not have any diagnosis of rheumatological disorder and was evaluated by rheumatology.  She does have a positive ANA.  No evidence of chronic liver disease or chronic kidney disease.  Her ferritin levels have fluctuated between 500-600 over the last 2 years without a clear rising trend and has remained in this range despite stopping alcohol intake.  She has undergone ultrasound abdomen in the past as well which did not show any evidence of fatty liver or cirrhosis.  No further testing is required at this time and I would monitor  her CBC CMP ferritin and iron studies in 2 months and 5 months from now and see her back in 5  months     Visit Diagnosis 1. Elevated ferritin      Dr. Owens Shark, MD, MPH [facility] [phone] 11/23/2023 7:53 AM

## 2023-12-30 ENCOUNTER — Encounter: Payer: Self-pay | Admitting: Family Medicine

## 2024-01-07 ENCOUNTER — Encounter: Payer: Self-pay | Admitting: Family Medicine

## 2024-01-07 ENCOUNTER — Ambulatory Visit (INDEPENDENT_AMBULATORY_CARE_PROVIDER_SITE_OTHER): Admitting: Family Medicine

## 2024-01-07 ENCOUNTER — Other Ambulatory Visit: Payer: Self-pay | Admitting: Physician Assistant

## 2024-01-07 ENCOUNTER — Other Ambulatory Visit: Payer: Self-pay | Admitting: Family Medicine

## 2024-01-07 VITALS — BP 171/84 | HR 73 | Resp 16 | Ht 62.0 in | Wt 129.8 lb

## 2024-01-07 DIAGNOSIS — G4700 Insomnia, unspecified: Secondary | ICD-10-CM

## 2024-01-07 DIAGNOSIS — I10 Essential (primary) hypertension: Secondary | ICD-10-CM | POA: Diagnosis not present

## 2024-01-07 DIAGNOSIS — E782 Mixed hyperlipidemia: Secondary | ICD-10-CM

## 2024-01-07 DIAGNOSIS — R12 Heartburn: Secondary | ICD-10-CM

## 2024-01-07 DIAGNOSIS — L719 Rosacea, unspecified: Secondary | ICD-10-CM

## 2024-01-07 DIAGNOSIS — N182 Chronic kidney disease, stage 2 (mild): Secondary | ICD-10-CM

## 2024-01-07 DIAGNOSIS — M797 Fibromyalgia: Secondary | ICD-10-CM

## 2024-01-07 DIAGNOSIS — F411 Generalized anxiety disorder: Secondary | ICD-10-CM

## 2024-01-07 DIAGNOSIS — E0500 Thyrotoxicosis with diffuse goiter without thyrotoxic crisis or storm: Secondary | ICD-10-CM | POA: Diagnosis not present

## 2024-01-07 MED ORDER — AMLODIPINE BESYLATE 5 MG PO TABS: 5.0000 mg | ORAL_TABLET | Freq: Every day | ORAL | 1 refills | Status: DC

## 2024-01-07 MED ORDER — ESCITALOPRAM OXALATE 10 MG PO TABS: 10.0000 mg | ORAL_TABLET | Freq: Every day | ORAL | 1 refills | Status: DC

## 2024-01-07 NOTE — Progress Notes (Unsigned)
 Established patient visit   Patient: Melanie Rhodes   DOB: [birth year]   73 y.o. Female  MRN: [id number] Visit Date: 01/07/2024  Today's healthcare provider: Carlean Charter, DO   Chief Complaint  Patient presents with   Medical Management of Chronic Issues    Patient stopped the Lisinopril  the beginning of April due to cough. Reports cough is better.   Subjective    HPI Melanie Rhodes is a 73 year old female who presents with a persistent cough and anxiety.  She has experienced a persistent dry, tickly cough throughout the winter, initially attributing it to environmental factors like dry air despite using two humidifiers. Improvement in her cough and associated lightheadedness was noted after discontinuing lisinopril , which she stopped taking around the beginning of April. She had been tapering the medication by taking it every other day starting March 20th.  She experiences muscle aches, which she initially thought were related to fibromyalgia, a condition she has been told she does not have. Muscle issues have been present for over twenty years, predating her use of rosuvastatin , which she started in 2023. The muscle pain sometimes presents as a burning sensation, particularly after standing for long periods, and she describes it as feeling like her 'frame hurts'.  She has a history of anxiety, which she associates with her high blood pressure readings at home, ranging from 128/84 to 171/84. She experiences heart palpitations and describes feeling her heart pounding out of her chest, especially when anxious. She has been monitoring her blood pressure at home, noting fluctuations. She attributes some of her anxiety to personal stressors, including her son's situation following his wife's death.  She has a history of hypothyroidism, which was previously treated but is no longer on medication as her thyroid  function normalized. She was told she would need lifelong treatment, but her  thyroid  levels have remained stable without medication.  She reports a history of positive ANA tests, raising concerns about lupus, though she has been told she does not have the condition. She experiences cold extremities and occasional joint pain, but no significant color changes in her fingers or toes. She has a history of elevated ferritin levels, which are being monitored by a hematologist.  Her levels remain elevated despite abstaining from alcohol use.  She experiences skin issues, including rosacea, for which she uses a cream. She also has dry patches and uses cortisone cream for relief. She avoids sun exposure to prevent flare-ups.  She takes trazodone  for sleep, usually one tablet, but sometimes increases to one and a half if needed. She has a history of using anxiety medications but does not recall specific names.  No vomiting but reports diarrhea. She experiences heart palpitations, cold extremities, and occasional joint pain. She reports restless legs affecting her sleep.      Medications: Outpatient Medications Prior to Visit  Medication Sig   Fluocinolone  Acetonide 0.01 % OIL Apply 1-2 drops to ears nightly as needed for itch and scale.   ketoconazole  (NIZORAL ) 2 % shampoo Massage into scalp daily and let sit several minutes before rinsing. Decrease to 2-3 times weekly for maintenance.   metroNIDAZOLE  (METROCREAM ) 0.75 % cream Apply topically to face for rosacea every night; use twice daily for flares.   rosuvastatin  (CRESTOR ) 10 MG tablet TAKE 1 TABLET BY MOUTH EVERY DAY   tacrolimus  (PROTOPIC ) 0.1 % ointment Apply topically to aa face and body qd/bid prn for red scaly patches   traZODone  (DESYREL ) 100 MG tablet  TAKE 0.5-1 TABLETS (50-100 MG TOTAL) BY MOUTH AT BEDTIME.   vitamin B-12 (CYANOCOBALAMIN) 1000 MCG tablet Take 1,000 mcg by mouth daily.   Vitamin D, Cholecalciferol, 25 MCG (1000 UT) CAPS Take by mouth daily. Patient takes 2 capsules daily.   [DISCONTINUED]  omeprazole  (PRILOSEC) 20 MG capsule TAKE 1 CAPSULE BY MOUTH EVERY DAY   [DISCONTINUED] lisinopril  (ZESTRIL ) 5 MG tablet Take 1 tablet (5 mg total) by mouth daily. (Patient not taking: Reported on 01/07/2024)   No facility-administered medications prior to visit.        Objective    BP (!) 171/84 (BP Location: Left Arm, Patient Position: Sitting, Cuff Size: Normal) Comment: Patient's BP cuff  Pulse 73   Resp 16   Ht 5\' 2"  (1.575 m)   Wt 129 lb 12.8 oz (58.9 kg)   SpO2 100%   BMI 23.74 kg/m     Physical Exam Vitals and nursing note reviewed.  Constitutional:      General: She is not in acute distress.    Appearance: Normal appearance.  HENT:     Head: Normocephalic and atraumatic.  Eyes:     General: No scleral icterus.    Conjunctiva/sclera: Conjunctivae normal.  Cardiovascular:     Rate and Rhythm: Normal rate.  Pulmonary:     Effort: Pulmonary effort is normal.  Neurological:     Mental Status: She is alert and oriented to person, place, and time. Mental status is at baseline.  Psychiatric:        Mood and Affect: Mood normal.        Behavior: Behavior normal.      No results found for any visits on 01/07/24.  Assessment & Plan    Essential hypertension -     amLODIPine  Besylate; Take 1 tablet (5 mg total) by mouth daily.  Dispense: 90 tablet; Refill: 1  Mixed hyperlipidemia  Chronic kidney disease, stage 2, mildly decreased GFR -     Microalbumin / creatinine urine ratio -     Urinalysis, Routine w reflex microscopic  Generalized anxiety disorder -     Escitalopram  Oxalate; Take 1 tablet (10 mg total) by mouth daily.  Dispense: 30 tablet; Refill: 1  Fibromyalgia  Graves' disease in remission  Rosacea      Hypertension Hypertension with recent lisinopril  discontinuation due to adverse effects. Home readings variable, possible white coat hypertension. Amlodipine  selected over losartan to avoid overcorrection. - Start amlodipine  5 mg daily. -  Continue home blood pressure monitoring and report readings. - Consider losartan if amlodipine  is not tolerated.  Chronic kidney disease stage 2 CKD stage 2 with GFR >60. No symptoms. Discussed importance of monitoring kidney function and avoiding NSAIDs. - Order urine tests for proteinuria and hematuria. - Avoid NSAIDs.  Anxiety Anxiety with palpitations and stressors. Not on medication. Discussed counseling and medication benefits. Lexapro  chosen for efficacy and cost. - Start Lexapro . - Monitor for side effects and report issues. - Consider counseling.  Rosacea Rosacea managed with effective topical cream. Sun exposure worsens symptoms.  Graves' disease in remission History of Graves' disease in remission, with consistent normal thyroid  function tests, not on medication. No acute concerns.  Continue to monitor.  Fibromyalgia Fibromyalgia with variable muscle aches. Previous rheumatology evaluation did not confirm diagnosis. CoQ10 optional as muscle aches predate statin use. - Consider CoQ10 if muscle aches related to statin use. - Provide exercises to strengthen back and manage symptoms.   Return in about 6 weeks (around 02/18/2024) for Anx/Dep.  I discussed the assessment and treatment plan with the patient  The patient was provided an opportunity to ask questions and all were answered. The patient agreed with the plan and demonstrated an understanding of the instructions.   The patient was advised to call back or seek an in-person evaluation if the symptoms worsen or if the condition fails to improve as anticipated.    Carlean Charter, DO  [facility] [phone] (phone) [phone] (fax)  [facility]

## 2024-01-13 ENCOUNTER — Inpatient Hospital Stay: Attending: Oncology

## 2024-01-13 DIAGNOSIS — Z79899 Other long term (current) drug therapy: Secondary | ICD-10-CM | POA: Diagnosis not present

## 2024-01-13 DIAGNOSIS — R7989 Other specified abnormal findings of blood chemistry: Secondary | ICD-10-CM | POA: Insufficient documentation

## 2024-01-13 LAB — CMP (CANCER CENTER ONLY)
ALT: 25 U/L (ref 0–44)
AST: 26 U/L (ref 15–41)
Albumin: 4.4 g/dL (ref 3.5–5.0)
Alkaline Phosphatase: 50 U/L (ref 38–126)
Anion gap: 11 (ref 5–15)
BUN: 16 mg/dL (ref 8–23)
CO2: 26 mmol/L (ref 22–32)
Calcium: 9 mg/dL (ref 8.9–10.3)
Chloride: 101 mmol/L (ref 98–111)
Creatinine: 0.71 mg/dL (ref 0.44–1.00)
GFR, Estimated: >60 mL/min (ref >60)
Glucose, Bld: 132 mg/dL — ABNORMAL HIGH (ref 70–99)
Potassium: 3.8 mmol/L (ref 3.5–5.1)
Sodium: 138 mmol/L (ref 135–145)
Total Bilirubin: 0.5 mg/dL (ref 0.0–1.2)
Total Protein: 7.6 g/dL (ref 6.5–8.1)

## 2024-01-13 LAB — CBC WITH DIFFERENTIAL (CANCER CENTER ONLY)
Abs Immature Granulocytes: 0.03 10*3/uL (ref 0.00–0.07)
Basophils Absolute: 0 10*3/uL (ref 0.0–0.1)
Basophils Relative: 1 %
Eosinophils Absolute: 0.1 10*3/uL (ref 0.0–0.5)
Eosinophils Relative: 1 %
HCT: 46.1 % — ABNORMAL HIGH (ref 36.0–46.0)
Hemoglobin: 15.1 g/dL — ABNORMAL HIGH (ref 12.0–15.0)
Immature Granulocytes: 1 %
Lymphocytes Relative: 10 %
Lymphs Abs: 0.7 10*3/uL (ref 0.7–4.0)
MCH: 30.5 pg (ref 26.0–34.0)
MCHC: 32.8 g/dL (ref 30.0–36.0)
MCV: 93.1 fL (ref 80.0–100.0)
Monocytes Absolute: 0.9 10*3/uL (ref 0.1–1.0)
Monocytes Relative: 15 %
Neutro Abs: 4.6 10*3/uL (ref 1.7–7.7)
Neutrophils Relative %: 72 %
Platelet Count: 220 10*3/uL (ref 150–400)
RBC: 4.95 MIL/uL (ref 3.87–5.11)
RDW: 13.2 % (ref 11.5–15.5)
WBC Count: 6.3 10*3/uL (ref 4.0–10.5)
nRBC: 0 % (ref 0.0–0.2)

## 2024-01-13 LAB — IRON AND TIBC
Iron: 46 ug/dL (ref 28–170)
Saturation Ratios: 12 % (ref 10.4–31.8)
TIBC: 395 ug/dL (ref 250–450)
UIBC: 349 ug/dL

## 2024-01-13 LAB — FERRITIN: Ferritin: 389 ng/mL — ABNORMAL HIGH (ref 11–307)

## 2024-01-15 ENCOUNTER — Ambulatory Visit
Admission: EM | Admit: 2024-01-15 | Discharge: 2024-01-15 | Disposition: A | Discharge status: Home / Self Care | Attending: Emergency Medicine | Admitting: Emergency Medicine

## 2024-01-15 DIAGNOSIS — J069 Acute upper respiratory infection, unspecified: Secondary | ICD-10-CM | POA: Diagnosis not present

## 2024-01-15 MED ORDER — AZITHROMYCIN 250 MG PO TABS: 250.0000 mg | ORAL_TABLET | Freq: Every day | ORAL | 0 refills | Status: DC

## 2024-01-15 NOTE — Discharge Instructions (Signed)
 Begin Azithromycin as directed for bacterial coverage You can take Tylenol and/or Ibuprofen as needed for fever reduction and pain relief.   For cough: honey 1/2 to 1 teaspoon (you can dilute the honey in water or another fluid).  You can also use guaifenesin and dextromethorphan for cough. You can use a humidifier for chest congestion and cough.  If you don't have a humidifier, you can sit in the bathroom with the hot shower running.      For sore throat: try warm salt water gargles, cepacol lozenges, throat spray, warm tea or water with lemon/honey, popsicles or ice, or OTC cold relief medicine for throat discomfort.   For congestion: take a daily anti-histamine like Zyrtec, Claritin, and a oral decongestant, such as pseudoephedrine.  You can also use Flonase 1-2 sprays in each nostril daily.   It is important to stay hydrated: drink plenty of fluids (water, gatorade/powerade/pedialyte, juices, or teas) to keep your throat moisturized and help further relieve irritation/discomfort.

## 2024-01-15 NOTE — ED Triage Notes (Signed)
 Patient presents to UC for chest congestion x 5 days. Treating symptom with mucinex, tylenol, and advil.

## 2024-01-15 NOTE — ED Provider Notes (Signed)
 Arlander Bellman    CSN: [id number] Arrival date & time: 01/15/24  1320      History   Chief Complaint Chief Complaint  Patient presents with   Nasal Congestion    HPI Melanie Rhodes is a 73 y.o. female.   Patient presents for evaluation of nasal congestion, rhinorrhea, nonproductive cough, chest congestion, sinus pressure to the forehead, intermittent headaches, wheezing only at nighttime and a scratchy sore throat present for 5 days.  Known sick contact in household.  Has attempted use of Mucinex, Tylenol and Advil.  Denies respiratory history, non-smoker.  Denies shortness of breath and fever.  Past Medical History:  Diagnosis Date   GERD (gastroesophageal reflux disease)    Hyperlipidemia     Patient Active Problem List   Diagnosis Date Noted   Essential hypertension 10/04/2023   Sicca syndrome (HCC) 10/04/2023   Personal history of colon polyps, unspecified 07/23/2023   Adenomatous polyp of colon 07/23/2023   Graves' disease in remission 06/24/2023   Eczema 06/24/2023   Rosacea 06/24/2023   Alcohol use 12/01/2022   Acute back pain 11/18/2022   Mixed hyperlipidemia 05/19/2022   Fibromyalgia 05/19/2022   High serum ferritin 05/19/2022   Insomnia 11/14/2021   Restless leg syndrome 11/14/2021   Damarko Stitely coat syndrome without diagnosis of hypertension 11/14/2021    Past Surgical History:  Procedure Laterality Date   BREAST BIOPSY Right 02/22/2020   affirm bx, distortion, x marker, benign   BREAST CYST ASPIRATION Left years ago   neg   COLONOSCOPY WITH PROPOFOL  N/A 11/09/2017   Procedure: COLONOSCOPY WITH PROPOFOL ;  Surgeon: Luke Salaam, MD;  Location: [facility] ENDOSCOPY;  Service: Gastroenterology;  Laterality: N/A;   COLONOSCOPY WITH PROPOFOL  N/A 07/23/2023   Procedure: COLONOSCOPY WITH PROPOFOL ;  Surgeon: Luke Salaam, MD;  Location: [facility] ENDOSCOPY;  Service: Gastroenterology;  Laterality: N/A;   NO PAST SURGERIES     POLYPECTOMY  07/23/2023   Procedure:  POLYPECTOMY;  Surgeon: Luke Salaam, MD;  Location: [facility] ENDOSCOPY;  Service: Gastroenterology;;    OB History   No obstetric history on file.      Home Medications    Prior to Admission medications   Medication Sig Start Date End Date Taking? Authorizing Provider  azithromycin (ZITHROMAX) 250 MG tablet Take 1 tablet (250 mg total) by mouth daily. Take first 2 tablets together, then 1 every day until finished. 01/15/24  Yes Khamauri Bauernfeind R, NP  amLODipine  (NORVASC ) 5 MG tablet Take 1 tablet (5 mg total) by mouth daily. 01/07/24   Carlean Charter, DO  escitalopram  (LEXAPRO ) 10 MG tablet Take 1 tablet (10 mg total) by mouth daily. 01/07/24   Carlean Charter, DO  Fluocinolone  Acetonide 0.01 % OIL Apply 1-2 drops to ears nightly as needed for itch and scale. 05/26/23   Artemio Larry, MD  ketoconazole  (NIZORAL ) 2 % shampoo Massage into scalp daily and let sit several minutes before rinsing. Decrease to 2-3 times weekly for maintenance. 05/26/23   Artemio Larry, MD  metroNIDAZOLE  (METROCREAM ) 0.75 % cream Apply topically to face for rosacea every night; use twice daily for flares. 05/26/23   Artemio Larry, MD  omeprazole  (PRILOSEC) 20 MG capsule TAKE 1 CAPSULE BY MOUTH EVERY DAY 01/08/24   Carlean Charter, DO  rosuvastatin  (CRESTOR ) 10 MG tablet TAKE 1 TABLET BY MOUTH EVERY DAY 09/30/23   Carlean Charter, DO  tacrolimus  (PROTOPIC ) 0.1 % ointment Apply topically to aa face and body qd/bid prn for red scaly patches 05/26/23  Artemio Larry, MD  traZODone  (DESYREL ) 100 MG tablet TAKE 0.5-1 TABLETS (50-100 MG TOTAL) BY MOUTH AT BEDTIME. 01/07/24   Carlean Charter, DO  vitamin B-12 (CYANOCOBALAMIN) 1000 MCG tablet Take 1,000 mcg by mouth daily.    [provider]  Vitamin D, Cholecalciferol, 25 MCG (1000 UT) CAPS Take by mouth daily. Patient takes 2 capsules daily.    [provider]    Family History Family History  Problem Relation Age of Onset   Hypertension Mother    GER disease Mother     GER disease Father    Hyperlipidemia Father    Diabetes Brother     Social History Social History   Tobacco Use   Smoking status: Never   Smokeless tobacco: Never  Vaping Use   Vaping status: Never Used  Substance Use Topics   Alcohol use: Yes    Alcohol/week: 7.0 standard drinks of alcohol    Types: 7 Glasses of wine per week    Comment: 1 glass of wine every day   Drug use: No     Allergies   Patient has no known allergies.   Review of Systems Review of Systems   Physical Exam Triage Vital Signs ED Triage Vitals  Encounter Vitals Group     BP 01/15/24 1331 (!) 159/78     Systolic BP Percentile --      Diastolic BP Percentile --      Pulse Rate 01/15/24 1331 100     Resp 01/15/24 1331 16     Temp 01/15/24 1331 98 F (36.7 C)     Temp Source 01/15/24 1331 Temporal     SpO2 01/15/24 1331 95 %     Weight --      Height --      Head Circumference --      Peak Flow --      Pain Score 01/15/24 1330 3     Pain Loc --      Pain Education --      Exclude from Growth Chart --    No data found.  Updated Vital Signs BP (!) 159/78 (BP Location: Left Arm)   Pulse 100   Temp 98 F (36.7 C) (Temporal)   Resp 16   SpO2 95%   Visual Acuity Right Eye Distance:   Left Eye Distance:   Bilateral Distance:    Right Eye Near:   Left Eye Near:    Bilateral Near:     Physical Exam Constitutional:      Appearance: Normal appearance.  HENT:     Head: Normocephalic.     Right Ear: Tympanic membrane, ear canal and external ear normal.     Left Ear: Tympanic membrane, ear canal and external ear normal.     Nose: Congestion present.     Mouth/Throat:     Mouth: Mucous membranes are moist.     Pharynx: Oropharynx is clear.  Eyes:     Extraocular Movements: Extraocular movements intact.  Cardiovascular:     Rate and Rhythm: Normal rate and regular rhythm.     Pulses: Normal pulses.     Heart sounds: Normal heart sounds.  Pulmonary:     Effort: Pulmonary  effort is normal.     Breath sounds: Normal breath sounds.  Musculoskeletal:     Cervical back: Normal range of motion and neck supple.  Neurological:     Mental Status: She is alert and oriented to person, place, and time. Mental status  is at baseline.      UC Treatments / Results  Labs (all labs ordered are listed, but only abnormal results are displayed) Labs Reviewed - No data to display  EKG   Radiology No results found.  Procedures Procedures (including critical care time)  Medications Ordered in UC Medications - No data to display  Initial Impression / Assessment and Plan / UC Course  I have reviewed the triage vital signs and the nursing notes.  Pertinent labs & imaging results that were available during my care of the patient were reviewed by me and considered in my medical decision making (see chart for details).  Acute URI  Patient is in no signs of distress nor toxic appearing.  Vital signs are stable.  Low suspicion for pneumonia, pneumothorax or bronchitis and therefore will defer imaging.  Allergy most likely viral, empirically placed on antibiotics as patient endorses worsening chest congestion.  Prescription for cough medicine as well as steroids.  May use additional over-the-counter medications as needed for supportive care.  May follow-up with urgent care as needed if symptoms persist or worsen.   Final Clinical Impressions(s) / UC Diagnoses   Final diagnoses:  Acute URI   Discharge Instructions      Begin Azithromycin as directed for bacterial coverage You can take Tylenol and/or Ibuprofen as needed for fever reduction and pain relief.   For cough: honey 1/2 to 1 teaspoon (you can dilute the honey in water or another fluid).  You can also use guaifenesin and dextromethorphan for cough. You can use a humidifier for chest congestion and cough.  If you don't have a humidifier, you can sit in the bathroom with the hot shower running.      For sore  throat: try warm salt water gargles, cepacol lozenges, throat spray, warm tea or water with lemon/honey, popsicles or ice, or OTC cold relief medicine for throat discomfort.   For congestion: take a daily anti-histamine like Zyrtec, Claritin, and a oral decongestant, such as pseudoephedrine.  You can also use Flonase 1-2 sprays in each nostril daily.   It is important to stay hydrated: drink plenty of fluids (water, gatorade/powerade/pedialyte, juices, or teas) to keep your throat moisturized and help further relieve irritation/discomfort.   ED Prescriptions     Medication Sig Dispense Auth. Provider   azithromycin (ZITHROMAX) 250 MG tablet Take 1 tablet (250 mg total) by mouth daily. Take first 2 tablets together, then 1 every day until finished. 6 tablet Oberon Hehir R, NP      PDMP not reviewed this encounter.   Reena Canning, [place] 01/15/24 [phone]

## 2024-01-27 ENCOUNTER — Ambulatory Visit: Admitting: Family Medicine

## 2024-01-29 ENCOUNTER — Other Ambulatory Visit: Payer: Self-pay | Admitting: Family Medicine

## 2024-01-29 DIAGNOSIS — F411 Generalized anxiety disorder: Secondary | ICD-10-CM

## 2024-02-16 ENCOUNTER — Ambulatory Visit: Admitting: Family Medicine

## 2024-02-16 ENCOUNTER — Encounter: Payer: Self-pay | Admitting: Family Medicine

## 2024-02-16 ENCOUNTER — Other Ambulatory Visit: Payer: Self-pay | Admitting: Family Medicine

## 2024-02-16 VITALS — BP 149/79 | HR 71 | Temp 98.3°F | Ht 62.0 in | Wt 131.2 lb

## 2024-02-16 DIAGNOSIS — F411 Generalized anxiety disorder: Secondary | ICD-10-CM | POA: Diagnosis not present

## 2024-02-16 DIAGNOSIS — H6191 Disorder of right external ear, unspecified: Secondary | ICD-10-CM

## 2024-02-16 DIAGNOSIS — N182 Chronic kidney disease, stage 2 (mild): Secondary | ICD-10-CM | POA: Diagnosis not present

## 2024-02-16 DIAGNOSIS — Z1231 Encounter for screening mammogram for malignant neoplasm of breast: Secondary | ICD-10-CM

## 2024-02-16 DIAGNOSIS — I10 Essential (primary) hypertension: Secondary | ICD-10-CM

## 2024-02-16 DIAGNOSIS — Z23 Encounter for immunization: Secondary | ICD-10-CM | POA: Diagnosis not present

## 2024-02-16 NOTE — Progress Notes (Signed)
 Established patient visit   Patient: Melanie Rhodes   DOB: 04/18/1951   73 y.o. Female  MRN: [id number] Visit Date: 02/16/2024  Today's healthcare provider: Carlean Charter, DO   Chief Complaint  Patient presents with   Medical Management of Chronic Issues    Follow up anxiety and Depression, HTN BP reading at home have been between 100's-110's/60 with the Amlodipine .   Subjective    HPI Melanie Rhodes is a 73 year old female who presents for follow-up on blood pressure management and lab results.  She has been taking amlodipine  for her hypertension, administered at night to prevent daytime dizziness. Her blood pressure has shown improvement from previous readings, which were as high as 170 mmHg. She has not initiated escitalopram  as her anxiety regarding blood pressure has decreased since starting amlodipine . - Home BPs 100s-120s/60s-70s; device validated at 01/07/2024 visit  Recent lab results from a hematologist indicated low ferritin levels, which have since improved. Her hemoglobin and hematocrit levels were slightly elevated, though she was not fasting during the test. She is concerned about multiple tests showing abnormal results, but notes that many were only slightly outside the normal range.  She has a persistent issue with her ear, characterized by a red, sensitive area on the cartilage that has been present for a few months. It becomes painful when she lies on it. She manages the discomfort by adjusting her sleeping position and applying lotion, and has an upcoming dermatology appointment in September - with Artemio Larry at [facility].  She experiences back pain, particularly when standing on cement for extended periods or walking excessively. She recently purchased new shoes to help alleviate the pain and plans to resume stretching exercises to improve her core strength.  She is physically active, walking two to three hours daily, and recently received a tetanus  shot. No dizziness or headaches currently. No symptoms related to kidney function.      Medications: Outpatient Medications Prior to Visit  Medication Sig   amLODipine  (NORVASC ) 5 MG tablet Take 1 tablet (5 mg total) by mouth daily.   azithromycin  (ZITHROMAX ) 250 MG tablet Take 1 tablet (250 mg total) by mouth daily. Take first 2 tablets together, then 1 every day until finished.   escitalopram  (LEXAPRO ) 10 MG tablet TAKE 1 TABLET BY MOUTH EVERY DAY (Patient not taking: Reported on 02/16/2024)   Fluocinolone  Acetonide 0.01 % OIL Apply 1-2 drops to ears nightly as needed for itch and scale.   ketoconazole  (NIZORAL ) 2 % shampoo Massage into scalp daily and let sit several minutes before rinsing. Decrease to 2-3 times weekly for maintenance.   metroNIDAZOLE  (METROCREAM ) 0.75 % cream Apply topically to face for rosacea every night; use twice daily for flares.   omeprazole  (PRILOSEC) 20 MG capsule TAKE 1 CAPSULE BY MOUTH EVERY DAY   rosuvastatin  (CRESTOR ) 10 MG tablet TAKE 1 TABLET BY MOUTH EVERY DAY   tacrolimus  (PROTOPIC ) 0.1 % ointment Apply topically to aa face and body qd/bid prn for red scaly patches   traZODone  (DESYREL ) 100 MG tablet TAKE 0.5-1 TABLETS (50-100 MG TOTAL) BY MOUTH AT BEDTIME.   vitamin B-12 (CYANOCOBALAMIN) 1000 MCG tablet Take 1,000 mcg by mouth daily.   Vitamin D, Cholecalciferol, 25 MCG (1000 UT) CAPS Take by mouth daily. Patient takes 2 capsules daily.   No facility-administered medications prior to visit.        Objective    BP (!) 149/79 (BP Location: Left Arm, Patient Position: Sitting,  Cuff Size: Normal)   Pulse 71   Temp 98.3 F (36.8 C) (Oral)   Ht 5\' 2"  (1.575 m)   Wt 131 lb 3.2 oz (59.5 kg)   SpO2 100%   BMI 24.00 kg/m     Physical Exam Vitals and nursing note reviewed.  Constitutional:      General: She is not in acute distress.    Appearance: Normal appearance.  HENT:     Head: Normocephalic and atraumatic.     Ears:      Comments: Skin  lesion with ulcerated center; present >3 months (image under media tab) Eyes:     General: No scleral icterus.    Conjunctiva/sclera: Conjunctivae normal.  Cardiovascular:     Rate and Rhythm: Normal rate.  Pulmonary:     Effort: Pulmonary effort is normal.  Neurological:     Mental Status: She is alert and oriented to person, place, and time. Mental status is at baseline.  Psychiatric:        Mood and Affect: Mood normal.        Behavior: Behavior normal.      No results found for any visits on 02/16/24.  Assessment & Plan    White coat syndrome with diagnosis of hypertension  Skin lesion of right ear  Encounter for Prevnar pneumococcal vaccination -     Pneumococcal conjugate vaccine 20-valent      White coat syndrome with diagnosis of hypertension Blood pressure improved with amlodipine . No significant side effects.  Home blood pressures within normal limits; home blood pressure device validated at 01/07/2024 visit.  Anxiety improved, declined to start escitalopram . - Continue amlodipine . - Regularly monitor blood pressure.  Chronic Kidney Disease Stage 2 Slightly decreased kidney function. Urine test pending. Advised on managing blood pressure, cholesterol, and blood sugar. - Urine test for proteinuria assessment; obtain sample. - Continue to optimize risk factors of blood pressure, cholesterol, and blood sugar. - Encourage regular physical activity.  Anxiety Anxiety symptoms improved after starting amlodipine . No current need for escitalopram . - Monitor anxiety symptoms and reconsider escitalopram  if symptoms worsen.  Ear Lesion Persistent ear lesion. Dermatology appointment scheduled. Advised to contact dermatologist sooner if needed.  Center of lesion appears ulcerated which is concerning.  However, the association of the lesion as a pressure point on which the patient lays is more reassuring of a benign lesion. - Continue to monitor lesion - Contact dermatologist  to determine if earlier appointment needed; pictures of the ear lesion available under media tab.  General Health Maintenance Received tetanus shot. Considering Prevnar 20. Mammogram and annual wellness visit pending. - Administer Prevnar 20 vaccine. - Ensure completion of mammogram appointment. - Complete annual wellness visit.    Follow-up Advised on dermatology and rheumatology follow-up. Plans to cancel rheumatology appointment. - Follow up with dermatologist regarding ear lesion. - Cancel rheumatology appointment if preferred if no new symptoms or concerns. - Schedule follow-up for blood pressure and kidney function monitoring in 4-6 months.  Return in about 6 months (around 08/17/2024) for Chronic f/u.      I discussed the assessment and treatment plan with the patient  The patient was provided an opportunity to ask questions and all were answered. The patient agreed with the plan and demonstrated an understanding of the instructions.   The patient was advised to call back or seek an in-person evaluation if the symptoms worsen or if the condition fails to improve as anticipated.    Hendrix Console N Luticia Tadros, DO  [facility] [phone] (phone) [phone] (fax)  [facility]

## 2024-03-07 ENCOUNTER — Ambulatory Visit
Admission: RE | Admit: 2024-03-07 | Discharge: 2024-03-07 | Disposition: A | Discharge status: Home / Self Care | Source: Ambulatory Visit | Attending: Family Medicine | Admitting: Family Medicine

## 2024-03-07 DIAGNOSIS — Z1231 Encounter for screening mammogram for malignant neoplasm of breast: Secondary | ICD-10-CM | POA: Diagnosis not present

## 2024-03-13 ENCOUNTER — Ambulatory Visit: Payer: Self-pay | Admitting: Family Medicine

## 2024-03-14 ENCOUNTER — Ambulatory Visit (INDEPENDENT_AMBULATORY_CARE_PROVIDER_SITE_OTHER): Admitting: Dermatology

## 2024-03-14 DIAGNOSIS — L570 Actinic keratosis: Secondary | ICD-10-CM

## 2024-03-14 DIAGNOSIS — W908XXA Exposure to other nonionizing radiation, initial encounter: Secondary | ICD-10-CM

## 2024-03-14 DIAGNOSIS — L578 Other skin changes due to chronic exposure to nonionizing radiation: Secondary | ICD-10-CM | POA: Diagnosis not present

## 2024-03-14 NOTE — Patient Instructions (Addendum)

## 2024-03-14 NOTE — Progress Notes (Signed)
   Follow-Up Visit   Subjective  Melanie Rhodes is a 73 y.o. female who presents for the following: place at R ear present x3 months, tender at cartilage, started out red and sore and gets a scab that comes and goes.  The patient has spots, moles and lesions to be evaluated, some may be new or changing and the patient may have concern these could be cancer.   The following portions of the chart were reviewed this encounter and updated as appropriate: medications, allergies, medical history  Review of Systems:  No other skin or systemic complaints except as noted in HPI or Assessment and Plan.  Objective  Well appearing patient in no apparent distress; mood and affect are within normal limits.  A focused examination was performed of the following areas: R ear  Relevant exam findings are noted in the Assessment and Plan.      Assessment & Plan   ACTINIC DAMAGE - chronic, secondary to cumulative UV radiation exposure/sun exposure over time - diffuse scaly erythematous macules with underlying dyspigmentation - Recommend daily broad spectrum sunscreen SPF 30+ to sun-exposed areas, reapply every 2 hours as needed.  - Recommend staying in the shade or wearing long sleeves, sun glasses (UVA+UVB protection) and wide brim hats (4-inch brim around the entire circumference of the hat). - Call for new or changing lesions.  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 Tender to touch. Photo taken today.  Treatment Plan: Benign-appearing.  CDNH generally happens at pressure point when sleeping. Discussed treating with cryotherapy today or shave removal/biopsy (scar).  Patient prefers to treat with cryotherapy today and recheck at follow-up already scheduled.   Recommend getting a travel pillow with hole when sleeping at night to keep pressure off.   Recommend applying Vaseline and keep it covered while healing post cryotherapy.   Destruction  Procedure Note Destruction method: cryotherapy   Informed consent: discussed and consent obtained   Lesion destroyed using liquid nitrogen: Yes   Outcome: patient tolerated procedure well with no complications   Post-procedure details: wound care instructions given   Locations: R antihelix # of Lesions Treated: 1  Prior to procedure, discussed risks of blister formation, small wound, skin dyspigmentation, or rare scar following cryotherapy. Recommend Vaseline ointment to treated areas while healing.     Return for As scheduled, w/ Dr. Jackquline, [facility], recheck R ear.  I, Jacquelynn V. Wilfred, CMA, am acting as scribe for Rexene Jackquline, MD .   Documentation: I have reviewed the above documentation for accuracy and completeness, and I agree with the above.  Rexene Jackquline, MD

## 2024-03-19 ENCOUNTER — Other Ambulatory Visit: Payer: Self-pay | Admitting: Family Medicine

## 2024-03-19 DIAGNOSIS — E782 Mixed hyperlipidemia: Secondary | ICD-10-CM

## 2024-04-02 ENCOUNTER — Other Ambulatory Visit: Payer: Self-pay | Admitting: Family Medicine

## 2024-04-02 DIAGNOSIS — G4700 Insomnia, unspecified: Secondary | ICD-10-CM

## 2024-04-14 ENCOUNTER — Other Ambulatory Visit: Payer: Self-pay | Admitting: Family Medicine

## 2024-04-14 DIAGNOSIS — R12 Heartburn: Secondary | ICD-10-CM

## 2024-04-15 ENCOUNTER — Inpatient Hospital Stay: Admitting: Oncology

## 2024-04-15 ENCOUNTER — Encounter: Payer: Self-pay | Admitting: Oncology

## 2024-04-15 ENCOUNTER — Inpatient Hospital Stay: Attending: Oncology

## 2024-04-15 VITALS — BP 138/82 | HR 73 | Temp 96.6°F | Resp 16 | Ht 62.0 in | Wt 129.8 lb

## 2024-04-15 DIAGNOSIS — Z8249 Family history of ischemic heart disease and other diseases of the circulatory system: Secondary | ICD-10-CM | POA: Diagnosis not present

## 2024-04-15 DIAGNOSIS — R5383 Other fatigue: Secondary | ICD-10-CM

## 2024-04-15 DIAGNOSIS — Z7289 Other problems related to lifestyle: Secondary | ICD-10-CM | POA: Diagnosis not present

## 2024-04-15 DIAGNOSIS — Z833 Family history of diabetes mellitus: Secondary | ICD-10-CM | POA: Diagnosis not present

## 2024-04-15 DIAGNOSIS — F1090 Alcohol use, unspecified, uncomplicated: Secondary | ICD-10-CM

## 2024-04-15 DIAGNOSIS — Z83438 Family history of other disorder of lipoprotein metabolism and other lipidemia: Secondary | ICD-10-CM | POA: Diagnosis not present

## 2024-04-15 DIAGNOSIS — Z8349 Family history of other endocrine, nutritional and metabolic diseases: Secondary | ICD-10-CM | POA: Insufficient documentation

## 2024-04-15 DIAGNOSIS — Z79899 Other long term (current) drug therapy: Secondary | ICD-10-CM | POA: Insufficient documentation

## 2024-04-15 DIAGNOSIS — R7989 Other specified abnormal findings of blood chemistry: Secondary | ICD-10-CM

## 2024-04-15 DIAGNOSIS — E785 Hyperlipidemia, unspecified: Secondary | ICD-10-CM | POA: Diagnosis not present

## 2024-04-15 LAB — CMP (CANCER CENTER ONLY)
ALT: 24 U/L (ref 0–44)
AST: 25 U/L (ref 15–41)
Albumin: 4.4 g/dL (ref 3.5–5.0)
Alkaline Phosphatase: 54 U/L (ref 38–126)
Anion gap: 10 (ref 5–15)
BUN: 18 mg/dL (ref 8–23)
CO2: 25 mmol/L (ref 22–32)
Calcium: 9.6 mg/dL (ref 8.9–10.3)
Chloride: 103 mmol/L (ref 98–111)
Creatinine: 0.79 mg/dL (ref 0.44–1.00)
GFR, Estimated: >60 mL/min (ref >60)
Glucose, Bld: 140 mg/dL — ABNORMAL HIGH (ref 70–99)
Potassium: 3.7 mmol/L (ref 3.5–5.1)
Sodium: 138 mmol/L (ref 135–145)
Total Bilirubin: 0.8 mg/dL (ref 0.0–1.2)
Total Protein: 7.5 g/dL (ref 6.5–8.1)

## 2024-04-15 LAB — CBC WITH DIFFERENTIAL (CANCER CENTER ONLY)
Abs Immature Granulocytes: 0.02 K/uL (ref 0.00–0.07)
Basophils Absolute: 0 K/uL (ref 0.0–0.1)
Basophils Relative: 1 %
Eosinophils Absolute: 0.1 K/uL (ref 0.0–0.5)
Eosinophils Relative: 1 %
HCT: 45.3 % (ref 36.0–46.0)
Hemoglobin: 14.9 g/dL (ref 12.0–15.0)
Immature Granulocytes: 0 %
Lymphocytes Relative: 21 %
Lymphs Abs: 1 K/uL (ref 0.7–4.0)
MCH: 31.2 pg (ref 26.0–34.0)
MCHC: 32.9 g/dL (ref 30.0–36.0)
MCV: 94.8 fL (ref 80.0–100.0)
Monocytes Absolute: 0.4 K/uL (ref 0.1–1.0)
Monocytes Relative: 9 %
Neutro Abs: 3.1 K/uL (ref 1.7–7.7)
Neutrophils Relative %: 68 %
Platelet Count: 253 K/uL (ref 150–400)
RBC: 4.78 MIL/uL (ref 3.87–5.11)
RDW: 13 % (ref 11.5–15.5)
WBC Count: 4.5 K/uL (ref 4.0–10.5)
nRBC: 0 % (ref 0.0–0.2)

## 2024-04-15 LAB — FERRITIN: Ferritin: 338 ng/mL — ABNORMAL HIGH (ref 11–307)

## 2024-04-15 LAB — IRON AND TIBC
Iron: 128 ug/dL (ref 28–170)
Saturation Ratios: 31 % (ref 10.4–31.8)
TIBC: 413 ug/dL (ref 250–450)
UIBC: 285 ug/dL

## 2024-04-15 NOTE — Progress Notes (Signed)
 Hematology/Oncology Consult note [facility]  [phone] [phone]  Patient Care Team: Donzella Lauraine SAILOR, DO as PCP - General (Family Medicine) Jackquline Sawyer, MD (Dermatology) System, Provider Not In Melanee Annah BROCKS, MD as Consulting Physician (Oncology)   Name of the patient: Melanie Rhodes  [id number]  [birth year]   Date of visit: 04/15/24  Diagnosis-elevated ferritin  Chief complaint/ Reason for visit-routine follow-up of elevated ferritin  Heme/Onc history: Patient is a 73 year old female with a past medical history significant for hypertension and hyperlipidemia.  She was evaluated by Dr. Babara and was last seen by her in April 2024 for elevated ferritin.  She underwent hemochromatosis testing at that time which was negative.  Liver functions have been normal.  At that time she was drinking alcohol few times a week and was thought to be one of the reasons for elevated ferritin.  Patient states that she has not had any alcohol since October 2024.  She has undergone ultrasound abdomen in the past which did not show any evidence of fatty liver or cirrhosis.  Despite that her ferritin levels have remained elevated and therefore patient has been referred back.  Ferritin levels over the last 2 years have been fluctuating between 500s to 600s without a clear rising trend.  Iron saturation has remained between 20 to 40%.  She was found to be ANA positive but does not have any known diagnosis of rheumatological disorder.  She was evaluated by rheumatology in January 2025 as well.    Interval history-patient has not gone back to drinking alcohol since October 2024.  Overall she feels well.  Weight has remained stable.  ECOG PS- 1 Pain scale- 0   Review of systems- Review of Systems  Constitutional:  Negative for chills, fever, malaise/fatigue and weight loss.  HENT:  Negative for congestion, ear discharge and nosebleeds.   Eyes:  Negative for blurred  vision.  Respiratory:  Negative for cough, hemoptysis, sputum production, shortness of breath and wheezing.   Cardiovascular:  Negative for chest pain, palpitations, orthopnea and claudication.  Gastrointestinal:  Negative for abdominal pain, blood in stool, constipation, diarrhea, heartburn, melena, nausea and vomiting.  Genitourinary:  Negative for dysuria, flank pain, frequency, hematuria and urgency.  Musculoskeletal:  Negative for back pain, joint pain and myalgias.  Skin:  Negative for rash.  Neurological:  Negative for dizziness, tingling, focal weakness, seizures, weakness and headaches.  Endo/Heme/Allergies:  Does not bruise/bleed easily.  Psychiatric/Behavioral:  Negative for depression and suicidal ideas. The patient does not have insomnia.       No Known Allergies   Past Medical History:  Diagnosis Date   Alcohol use 12/01/2022   GERD (gastroesophageal reflux disease)    Hyperlipidemia      Past Surgical History:  Procedure Laterality Date   BREAST BIOPSY Right 02/22/2020   affirm bx, distortion, x marker, benign   BREAST CYST ASPIRATION Left years ago   neg   COLONOSCOPY WITH PROPOFOL  N/A 11/09/2017   Procedure: COLONOSCOPY WITH PROPOFOL ;  Surgeon: Therisa Bi, MD;  Location: [facility] ENDOSCOPY;  Service: Gastroenterology;  Laterality: N/A;   COLONOSCOPY WITH PROPOFOL  N/A 07/23/2023   Procedure: COLONOSCOPY WITH PROPOFOL ;  Surgeon: Therisa Bi, MD;  Location: [facility] ENDOSCOPY;  Service: Gastroenterology;  Laterality: N/A;   NO PAST SURGERIES     POLYPECTOMY  07/23/2023   Procedure: POLYPECTOMY;  Surgeon: Therisa Bi, MD;  Location: [facility] ENDOSCOPY;  Service: Gastroenterology;;    Social History   Socioeconomic History  Marital status: Married    Spouse name: Not on file   Number of children: 2   Years of education: Not on file   Highest education level: 12th grade  Occupational History   Occupation: retired  Tobacco Use   Smoking status: Never   Smokeless  tobacco: Never  Vaping Use   Vaping status: Never Used  Substance and Sexual Activity   Alcohol use: Yes    Alcohol/week: 7.0 standard drinks of alcohol    Types: 7 Glasses of wine per week    Comment: 1 glass of wine every day   Drug use: No   Sexual activity: Not on file  Other Topics Concern   Not on file  Social History Narrative   Not on file   Social Drivers of Health   Financial Resource Strain: Low Risk  (09/21/2023)   Received from [facility]   Overall Financial Resource Strain (CARDIA)    Difficulty of Paying Living Expenses: Not very hard  Food Insecurity: No Food Insecurity (09/21/2023)   Received from [facility]   Hunger Vital Sign    Within the past 12 months, you worried that your food would run out before you got the money to buy more.: Never true    Within the past 12 months, the food you bought just didn't last and you didn't have money to get more.: Never true  Transportation Needs: No Transportation Needs (09/21/2023)   PRAPARE - Administrator, Civil Service (Medical): No    Lack of Transportation (Non-Medical): No  Physical Activity: Sufficiently Active (09/21/2023)   Exercise Vital Sign    Days of Exercise per Week: 5 days    Minutes of Exercise per Session: 40 min  Stress: Stress Concern Present (09/21/2023)   [employer] of Occupational Health - Occupational Stress Questionnaire    Feeling of Stress : To some extent  Social Connections: Moderately Isolated (09/21/2023)   Social Connection and Isolation Panel    Frequency of Communication with Friends and Family: More than three times a week    Frequency of Social Gatherings with Friends and Family: Twice a week    Attends Religious Services: Never    Database administrator or Organizations: No    Attends Engineer, structural: Not on file    Marital Status: Married  Catering manager Violence: Not At Risk (10/29/2020)   Humiliation, Afraid,  Rape, and Kick questionnaire    Fear of Current or Ex-Partner: No    Emotionally Abused: No    Physically Abused: No    Sexually Abused: No    Family History  Problem Relation Age of Onset   Hypertension Mother    GER disease Mother    GER disease Father    Hyperlipidemia Father    Diabetes Brother    Breast cancer Neg Hx      Current Outpatient Medications:    amLODipine  (NORVASC ) 5 MG tablet, Take 1 tablet (5 mg total) by mouth daily. (Patient taking differently: Take 5 mg by mouth. Every other day), Disp: 90 tablet, Rfl: 1   Fluocinolone  Acetonide 0.01 % OIL, Apply 1-2 drops to ears nightly as needed for itch and scale., Disp: 20 mL, Rfl: 5   ketoconazole  (NIZORAL ) 2 % shampoo, Massage into scalp daily and let sit several minutes before rinsing. Decrease to 2-3 times weekly for maintenance., Disp: 120 mL, Rfl: 11   metroNIDAZOLE  (METROCREAM ) 0.75 % cream, Apply topically to face  for rosacea every night; use twice daily for flares., Disp: 45 g, Rfl: 11   omeprazole  (PRILOSEC) 20 MG capsule, TAKE 1 CAPSULE BY MOUTH EVERY DAY, Disp: 90 capsule, Rfl: 0   rosuvastatin  (CRESTOR ) 10 MG tablet, TAKE 1 TABLET BY MOUTH EVERY DAY, Disp: 90 tablet, Rfl: 1   tacrolimus  (PROTOPIC ) 0.1 % ointment, Apply topically to aa face and body qd/bid prn for red scaly patches, Disp: 30 g, Rfl: 3   traZODone  (DESYREL ) 100 MG tablet, TAKE 0.5-1 TABLETS (50-100 MG TOTAL) BY MOUTH AT BEDTIME., Disp: 90 tablet, Rfl: 3   vitamin B-12 (CYANOCOBALAMIN) 1000 MCG tablet, Take 1,000 mcg by mouth daily., Disp: , Rfl:    Vitamin D, Cholecalciferol, 25 MCG (1000 UT) CAPS, Take by mouth daily. Patient takes 2 capsules daily., Disp: , Rfl:   Physical exam:  Vitals:   04/15/24 0930 04/15/24 0948  BP: (!) 146/78 138/82  Pulse: 73   Resp: 16   Temp: (!) 96.6 F (35.9 C)   TempSrc: Tympanic   SpO2: 100%   Weight: 129 lb 12.8 oz (58.9 kg)   Height: 5' 2 (1.575 m)    Physical Exam Cardiovascular:     Rate and  Rhythm: Normal rate and regular rhythm.     Heart sounds: Normal heart sounds.  Pulmonary:     Effort: Pulmonary effort is normal.     Breath sounds: Normal breath sounds.  Skin:    General: Skin is warm and dry.  Neurological:     Mental Status: She is alert and oriented to person, place, and time.      I have personally reviewed labs listed below:    Latest Ref Rng & Units 04/15/2024    9:29 AM  CMP  Glucose 70 - 99 mg/dL 859   BUN 8 - 23 mg/dL 18   Creatinine 9.55 - 1.00 mg/dL 9.20   Sodium 864 - 854 mmol/L 138   Potassium 3.5 - 5.1 mmol/L 3.7   Chloride 98 - 111 mmol/L 103   CO2 22 - 32 mmol/L 25   Calcium  8.9 - 10.3 mg/dL 9.6   Total Protein 6.5 - 8.1 g/dL 7.5   Total Bilirubin 0.0 - 1.2 mg/dL 0.8   Alkaline Phos 38 - 126 U/L 54   AST 15 - 41 U/L 25   ALT 0 - 44 U/L 24       Latest Ref Rng & Units 04/15/2024    9:28 AM  CBC  WBC 4.0 - 10.5 K/uL 4.5   Hemoglobin 12.0 - 15.0 g/dL 85.0   Hematocrit 63.9 - 46.0 % 45.3   Platelets 150 - 400 K/uL 253      Assessment and plan- Patient is a 73 y.o. female here for routine follow-up of elevated ferritin  Ferritin levels from today are pending.  In May 2025 she was found to have a ferritin of 389 which was improved as compared to prior values which have been in the 500s to 600s.  Hemochromatosis testing negative.  CBC and CMP within normal limits.  She will get 30-month labs checked with her primary care doctor and I will see her back in 1 year with CBC with differential CMP and ferritin.  Other than routine surveillance nothing more is indicated for her elevated ferritin levels at this time   Visit Diagnosis 1. Elevated ferritin   2. Alcohol use   3. Other fatigue      Dr. Annah Skene, MD, MPH [facility] [phone] 04/15/2024 12:50 PM

## 2024-05-11 ENCOUNTER — Encounter

## 2024-06-01 ENCOUNTER — Encounter: Payer: Self-pay | Admitting: Family Medicine

## 2024-06-01 ENCOUNTER — Ambulatory Visit (INDEPENDENT_AMBULATORY_CARE_PROVIDER_SITE_OTHER): Admitting: Family Medicine

## 2024-06-01 VITALS — BP 121/78 | HR 70 | Temp 98.7°F | Resp 18 | Ht 62.0 in | Wt 129.6 lb

## 2024-06-01 DIAGNOSIS — M546 Pain in thoracic spine: Secondary | ICD-10-CM | POA: Diagnosis not present

## 2024-06-01 DIAGNOSIS — I10 Essential (primary) hypertension: Secondary | ICD-10-CM

## 2024-06-01 DIAGNOSIS — G629 Polyneuropathy, unspecified: Secondary | ICD-10-CM

## 2024-06-01 DIAGNOSIS — M545 Low back pain, unspecified: Secondary | ICD-10-CM | POA: Diagnosis not present

## 2024-06-01 LAB — POCT URINALYSIS DIPSTICK
Bilirubin, UA: NEGATIVE
Blood, UA: NEGATIVE
Glucose, UA: NEGATIVE
Ketones, UA: NEGATIVE
Nitrite, UA: NEGATIVE
Protein, UA: NEGATIVE
Spec Grav, UA: 1.01 (ref 1.010–1.025)
Urobilinogen, UA: 0.2 U/dL
pH, UA: 6.5 (ref 5.0–8.0)

## 2024-06-01 MED ORDER — TIZANIDINE HCL 4 MG PO CAPS: 4.0000 mg | ORAL_CAPSULE | Freq: Three times a day (TID) | ORAL | 0 refills | Status: DC

## 2024-06-01 NOTE — Progress Notes (Signed)
 ACUTE VISIT   Patient: Melanie Rhodes   DOB: June 17, 1951   73 y.o. Female  MRN: [id number]   PCP: Donzella Lauraine SAILOR, DO  Chief Complaint  Patient presents with   Acute Visit    Back pain that seems to be different every day, some burning sensations started months ago.  Concerned that is muscular or skeletal issues.   Subjective    HPI HPI     Acute Visit    Additional comments: Back pain that seems to be different every day, some burning sensations started months ago.  Concerned that is muscular or skeletal issues.      Last edited by Terrel Powell CROME, CMA on 06/01/2024  3:55 PM.       Discussed the use of AI scribe software for clinical note transcription with the patient, who gave verbal consent to proceed.  History of Present Illness Melanie Rhodes is a 73 year old female with fibromyalgia who presents with acute back pain.  She has been experiencing burning back pain located under her bra line for several weeks. The pain is described as burning inside and is sometimes accompanied by a sensation of a knot, particularly after physical activities like housework. The pain varies daily, sometimes spreading across her back.  She has been using icy hot patches and alternating between a heating pad and ice pack for relief, though she is uncertain of their effectiveness. Her husband assists with applying the patches and has not noticed any skin changes. No history of shingles and she has received the shingles vaccine.  She reports a sensation of tightness and muscle pain in her back, which sometimes radiates to her buttocks, affecting her ability to walk at the mall.  She has a history of restless legs and has been tested for ferritin levels, which were found to be high. She has tested positive for lupus twice but has been told she does not have the condition. She is currently not taking gabapentin due to family experiences with the medication.  She has been told she has chronic  kidney disease and recalls her creatinine was normal and her GFR was greater than 60. She has not experienced burning during urination and has never had a UTI, though she urinates frequently at night.     Medications: Outpatient Medications Prior to Visit  Medication Sig   amLODipine  (NORVASC ) 5 MG tablet Take 1 tablet (5 mg total) by mouth daily. (Patient taking differently: Take 5 mg by mouth. Every other day)   Fluocinolone  Acetonide 0.01 % OIL Apply 1-2 drops to ears nightly as needed for itch and scale.   ketoconazole  (NIZORAL ) 2 % shampoo Massage into scalp daily and let sit several minutes before rinsing. Decrease to 2-3 times weekly for maintenance.   metroNIDAZOLE  (METROCREAM ) 0.75 % cream Apply topically to face for rosacea every night; use twice daily for flares.   omeprazole  (PRILOSEC) 20 MG capsule TAKE 1 CAPSULE BY MOUTH EVERY DAY   rosuvastatin  (CRESTOR ) 10 MG tablet TAKE 1 TABLET BY MOUTH EVERY DAY   tacrolimus  (PROTOPIC ) 0.1 % ointment Apply topically to aa face and body qd/bid prn for red scaly patches   traZODone  (DESYREL ) 100 MG tablet TAKE 0.5-1 TABLETS (50-100 MG TOTAL) BY MOUTH AT BEDTIME.   vitamin B-12 (CYANOCOBALAMIN) 1000 MCG tablet Take 1,000 mcg by mouth daily.   Vitamin D, Cholecalciferol, 25 MCG (1000 UT) CAPS Take by mouth daily. Patient takes 2 capsules daily.   No facility-administered medications  prior to visit.    Last metabolic panel Lab Results  Component Value Date   GLUCOSE 140 (H) 04/15/2024   NA 138 04/15/2024   K 3.7 04/15/2024   CL 103 04/15/2024   CO2 25 04/15/2024   BUN 18 04/15/2024   CREATININE 0.79 04/15/2024   GFRNONAA >60 04/15/2024   CALCIUM  9.6 04/15/2024   PROT 7.5 04/15/2024   ALBUMIN 4.4 04/15/2024   LABGLOB 2.5 06/25/2023   AGRATIO 2.2 11/18/2022   BILITOT 0.8 04/15/2024   ALKPHOS 54 04/15/2024   AST 25 04/15/2024   ALT 24 04/15/2024   ANIONGAP 10 04/15/2024        Objective    BP 121/78 Comment: home BP from  last check  Pulse 70   Temp 98.7 F (37.1 C) (Oral)   Resp 18   Ht 5' 2 (1.575 m)   Wt 129 lb 9.6 oz (58.8 kg)   BMI 23.70 kg/m   BP Readings from Last 3 Encounters:  06/01/24 121/78  04/15/24 138/82  02/16/24 (!) 149/79   Wt Readings from Last 3 Encounters:  06/01/24 129 lb 9.6 oz (58.8 kg)  04/15/24 129 lb 12.8 oz (58.9 kg)  02/16/24 131 lb 3.2 oz (59.5 kg)      Physical Exam   Physical Exam CHEST: Skin on thoracic region healthy, dry, no lesions. MUSCULOSKELETAL: Tenderness over right thoracic region. Normal range of motion. Tenderness in lumbar region, not midline.   Results for orders placed or performed in visit on 06/01/24  POCT Urinalysis Dipstick  Result Value Ref Range   Color, UA LIght Yellow    Clarity, UA Clear    Glucose, UA Negative Negative   Bilirubin, UA Negative    Ketones, UA Negative    Spec Grav, UA 1.010 1.010 - 1.025   Blood, UA Negative    pH, UA 6.5 5.0 - 8.0   Protein, UA Negative Negative   Urobilinogen, UA 0.2 0.2 or 1.0 E.U./dL   Nitrite, UA Negative    Leukocytes, UA Small (1+) (A) Negative   Appearance     Odor       Assessment & Plan      Assessment & Plan Chronic thoracic and lumbar back pain Chronic thoracic and lumbar back pain with burning sensation under the bra line, likely due to muscle spasm and possible neuropathy. No skin changes or signs of shingles. Pain exacerbated by physical activity and relieved by rest. Differential includes neuropathy and muscle spasm. She declines gabapentin due to family history of adverse effects and prefers to try muscle relaxers first. - Prescribe tizanidine  4 mg every 6 hours as needed for muscle spasm. - Recommend massage therapy or osteopathic manipulation for muscle tension. - Advise alternating ice and heat therapy for symptomatic relief. - Perform point of care urinalysis to rule out UTI.Negative for UTI   Hypertension Chronic  Repeat 148/96 Blood pressure reading of 172/78  mmHg, indicating white coat controlled hypertension. -reviewed her home BP records with ranges in 110s/70s with HR in 60s since May 2025. 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 -f/u with PCP as directed  -continue oral hydration  - last creatinine was 0.79 with GFR >60       Return in about 1 month (around 07/01/2024) for thoracic back pain , HTN.  Rockie Agent, MD  [facility] [phone] (phone) [phone] (fax)  [facility]

## 2024-06-01 NOTE — Patient Instructions (Signed)
 To keep you healthy, please keep in mind the following health maintenance items that you are due for:   Health Maintenance Due  Topic Date Due   Medicare Annual Wellness (AWV)  11/18/2023   Influenza Vaccine  04/08/2024   COVID-19 Vaccine (4 - 2025-26 season) 05/09/2024     Best Wishes,   Dr. Lang

## 2024-06-06 ENCOUNTER — Ambulatory Visit: Payer: Medicare Other | Admitting: Dermatology

## 2024-06-06 DIAGNOSIS — W908XXA Exposure to other nonionizing radiation, initial encounter: Secondary | ICD-10-CM

## 2024-06-06 DIAGNOSIS — L814 Other melanin hyperpigmentation: Secondary | ICD-10-CM | POA: Diagnosis not present

## 2024-06-06 DIAGNOSIS — D229 Melanocytic nevi, unspecified: Secondary | ICD-10-CM

## 2024-06-06 DIAGNOSIS — L209 Atopic dermatitis, unspecified: Secondary | ICD-10-CM

## 2024-06-06 DIAGNOSIS — H61001 Unspecified perichondritis of right external ear: Secondary | ICD-10-CM | POA: Diagnosis not present

## 2024-06-06 DIAGNOSIS — L719 Rosacea, unspecified: Secondary | ICD-10-CM

## 2024-06-06 DIAGNOSIS — R202 Paresthesia of skin: Secondary | ICD-10-CM

## 2024-06-06 DIAGNOSIS — D1801 Hemangioma of skin and subcutaneous tissue: Secondary | ICD-10-CM

## 2024-06-06 DIAGNOSIS — L853 Xerosis cutis: Secondary | ICD-10-CM

## 2024-06-06 DIAGNOSIS — L219 Seborrheic dermatitis, unspecified: Secondary | ICD-10-CM

## 2024-06-06 DIAGNOSIS — L578 Other skin changes due to chronic exposure to nonionizing radiation: Secondary | ICD-10-CM | POA: Diagnosis not present

## 2024-06-06 DIAGNOSIS — Z1283 Encounter for screening for malignant neoplasm of skin: Secondary | ICD-10-CM | POA: Diagnosis not present

## 2024-06-06 DIAGNOSIS — L821 Other seborrheic keratosis: Secondary | ICD-10-CM | POA: Diagnosis not present

## 2024-06-06 MED ORDER — METRONIDAZOLE 0.75 % EX CREA: TOPICAL_CREAM | CUTANEOUS | 11 refills | Status: AC

## 2024-06-06 MED ORDER — KETOCONAZOLE 2 % EX CREA: TOPICAL_CREAM | CUTANEOUS | 2 refills | Status: AC

## 2024-06-06 MED ORDER — KETOCONAZOLE 2 % EX SHAM: MEDICATED_SHAMPOO | CUTANEOUS | 11 refills | Status: AC

## 2024-06-06 MED ORDER — TACROLIMUS 0.1 % EX OINT: TOPICAL_OINTMENT | CUTANEOUS | 3 refills | Status: AC

## 2024-06-06 NOTE — Progress Notes (Signed)
 Follow-Up Visit   Subjective  Melanie Rhodes is a 73 y.o. female who presents for the following: Skin Cancer Screening and Full Body Skin Exam Hx of rosacea, hx of seborrheic dermatitis, hx of atopic dermatitis  Spot at right ear treated last time with cryotherapy- still there but less painful.  The patient presents for Total-Body Skin Exam (TBSE) for skin cancer screening and mole check. The patient has spots, moles and lesions to be evaluated, some may be new or changing and the patient may have concern these could be cancer.    The following portions of the chart were reviewed this encounter and updated as appropriate: medications, allergies, medical history  Review of Systems:  No other skin or systemic complaints except as noted in HPI or Assessment and Plan.  Objective  Well appearing patient in no apparent distress; mood and affect are within normal limits.  A full examination was performed including scalp, head, eyes, ears, nose, lips, neck, chest, axillae, abdomen, back, buttocks, bilateral upper extremities, bilateral lower extremities, hands, feet, fingers, toes, fingernails, and toenails. All findings within normal limits unless otherwise noted below.   Relevant physical exam findings are noted in the Assessment and Plan.  right antihelix 5 mm pink papule with central erosion see photos   Assessment & Plan   SKIN CANCER SCREENING PERFORMED TODAY.  ACTINIC DAMAGE - Chronic condition, secondary to cumulative UV/sun exposure - diffuse scaly erythematous macules with underlying dyspigmentation - Recommend daily broad spectrum sunscreen SPF 30+ to sun-exposed areas, reapply every 2 hours as needed.  - Staying in the shade or wearing long sleeves, sun glasses (UVA+UVB protection) and wide brim hats (4-inch brim around the entire circumference of the hat) are also recommended for sun protection.  - Call for new or changing lesions.  LENTIGINES, SEBORRHEIC KERATOSES,  HEMANGIOMAS - Benign normal skin lesions - Benign-appearing - Call for any changes   MELANOCYTIC NEVI - Tan-brown and/or pink-flesh-colored symmetric macules and papules - Benign appearing on exam today - Observation - Call clinic for new or changing moles - Recommend daily use of broad spectrum spf 30+ sunscreen to sun-exposed areas.   SEBORRHEIC DERMATITIS Exam: Mild scaling at eyebrows, postauricular, b/l temporal hairline    Chronic and persistent condition with duration or expected duration over one year. Condition is symptomatic/ bothersome to patient. Not currently at goal.     Seborrheic Dermatitis is a chronic persistent rash characterized by pinkness and scaling most commonly of the mid face but also can occur on the scalp (dandruff), ears; mid chest, mid back and groin.  It tends to be exacerbated by stress and cooler weather.  People who have neurologic disease may experience new onset or exacerbation of existing seborrheic dermatitis.  The condition is not curable but treatable and can be controlled.   Treatment Plan: Continue Dermotic  oil Apply 1-2 drops to ears nightly as needed for itch and scale   Continue ketoconazole  2% shampoo massage into scalp and let sit several minutes before rinsing.   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 Condition is improving with treatment but not currently at goal.      Rosacea is a chronic progressive skin condition usually affecting the face of adults, causing redness and/or acne bumps. It is treatable but not curable. It sometimes affects the eyes (ocular rosacea) as well. It may respond to topical and/or systemic medication and  can flare with stress, sun exposure, alcohol, exercise, topical steroids (including hydrocortisone /cortisone 10) and some foods.  Daily application of broad spectrum spf 30+ sunscreen to face is  recommended to reduce flares.   Patient denies grittiness of the eyes.   Treatment Plan Continue metronidazole  0.75% cream Apply to face at bedtime for rosacea dsp 45g 6Rf   ATOPIC DERMATITIS    Exam: skin clear today, mild xerosis cheeks, forehead   <1% BSA   Chronic condition with duration or expected duration over one year. Currently well-controlled.      Atopic dermatitis (eczema) is a chronic, relapsing, pruritic condition that can significantly affect quality of life. It is often associated with allergic rhinitis and/or asthma and can require treatment with topical medications, phototherapy, or in severe cases biologic injectable medication (Dupixent; Adbry) or Oral JAK inhibitors.   Treatment Plan: Continue tacrolimus  ointment Apply to red, itchy rash on face and body once to twice daily as needed For eczema    Recommend gentle skin care.  Xerosis - diffuse xerotic patches - recommend gentle, hydrating skin care - gentle skin care handout given  NOTALGIA PARESTHETICA Exam: Perispinal hyperpigmented patch right spinal mid back Chronic condition without cure secondary to pinched nerve along spine causing itching or sensation changes in an area of skin. Chronic rubbing or scratching causes darkening of the skin.  OTC treatments which can help with itch include numbing creams like pramoxine or lidocaine  which temporarily reduce itch or Capsaicin-containing creams which cause a burning sensation but which sometimes over time will reset the nerves to stop producing itch.  If you choose to use Capsaicin cream, it is recommended to use it 5 times daily for 1 week followed by 3 times daily for 3-6 weeks. You may have to continue using it long-term.  If not doing well with OTC options, could consider [facility] compounded prescription anti-itch cream with Amitriptyline 5% / Lidocaine  5% / Pramoxine 1% or Amitriptyline 5% / Gabapentin 10% / Lidocaine  5% Cream or other prescription  cream or pill options.   ROSACEA   Related Medications metroNIDAZOLE  (METROCREAM ) 0.75 % cream Apply topically to face for rosacea every night; use twice daily for flares. ATOPIC DERMATITIS, UNSPECIFIED TYPE   Related Medications tacrolimus  (PROTOPIC ) 0.1 % ointment Apply topically to aa face and body qd/bid prn for red scaly patches SEBORRHEIC DERMATITIS   Related Medications ketoconazole  (NIZORAL ) 2 % shampoo Massage into scalp daily and let sit several minutes before rinsing. Decrease to 2-3 times weekly for maintenance. ketoconazole  (NIZORAL ) 2 % cream Apply to pink patches on face 1-2 times a day as directed until improved. 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  CDNH generally happens at pressure point when sleeping. Discussed treating with cryotherapy, ILK injection, or shave removal/biopsy (scar).  Patient prefers to treat with ILK injections will recheck at follow up   Recommend getting a travel pillow with hole when sleeping at night to keep pressure off ear.    Intralesional injection - right antihelix Location: Right antihelix  Informed Consent: Discussed risks (infection, pain, bleeding, bruising, thinning of the skin, loss of skin pigment, lack of resolution, and recurrence of lesion) and benefits of the procedure, as well as the alternatives. Informed consent was obtained. Preparation: The area was prepared a standard fashion.  Procedure Details: An intralesional injection was performed with Kenalog 5 mg/cc. 0.1 cc in total were injected.  Total number of injections: 2  Plan: The patient was instructed on post-op care. Recommend  OTC analgesia as needed for pain.   NDC 0003 [id number] 20 Lot [id number] Sept 2027   Return in about 1 year (around 06/06/2025) for TBSE.  I, Eleanor Blush, CMA, am acting as scribe for Rexene Rattler, MD.   Documentation: I have reviewed the above documentation for accuracy and completeness,  and I agree with the above.  Rexene Rattler, MD

## 2024-06-06 NOTE — Patient Instructions (Addendum)
 For Eczema  For patchy itchy dry area  Continue tacrolimus  ointment - apply to itchy rash on face , arms and body once to twice daily as needed   For rosacea   Continue metronidazole  0.75 cream - apply to face for red bumpy skin on face at nose and cheeks bedtime for rosacea     For seborrheic dermatitis  For scaly greasy patches at hairline, behind ears, around nose, eyebrows and scalp  Continue ketoconazole  shampoo - massage into scalp and let sit several minutes before rinsing Continue ketoconazole  cream - apply topically to affected areas once twice daily     Melanoma ABCDEs  Melanoma is the most dangerous type of skin cancer, and is the leading cause of death from skin disease.  You are more likely to develop melanoma if you: Have light-colored skin, light-colored eyes, or red or blond hair Spend a lot of time in the sun Tan regularly, either outdoors or in a tanning bed Have had blistering sunburns, especially during childhood Have a close family member who has had a melanoma Have atypical moles or large birthmarks  Early detection of melanoma is key since treatment is typically straightforward and cure rates are extremely high if we catch it early.   The first sign of melanoma is often a change in a mole or a new dark spot.  The ABCDE system is a way of remembering the signs of melanoma.  A for asymmetry:  The two halves do not match. B for border:  The edges of the growth are irregular. C for color:  A mixture of colors are present instead of an even brown color. D for diameter:  Melanomas are usually (but not always) greater than 6mm - the size of a pencil eraser. E for evolution:  The spot keeps changing in size, shape, and color.  Please check your skin once per month between visits. You can use a small mirror in front and a large mirror behind you to keep an eye on the back side or your body.   If you see any new or changing lesions before your next follow-up,  please call to schedule a visit.  Please continue daily skin protection including broad spectrum sunscreen SPF 30+ to sun-exposed areas, reapplying every 2 hours as needed when you're outdoors.   Staying in the shade or wearing long sleeves, sun glasses (UVA+UVB protection) and wide brim hats (4-inch brim around the entire circumference of the hat) are also recommended for sun protection.    Due to recent changes in healthcare laws, you may see results of your pathology and/or laboratory studies on MyChart before the doctors have had a chance to review them. We understand that in some cases there may be results that are confusing or concerning to you. Please understand that not all results are received at the same time and often the doctors may need to interpret multiple results in order to provide you with the best plan of care or course of treatment. Therefore, we ask that you please give us  2 business days to thoroughly review all your results before contacting the office for clarification. Should we see a critical lab result, you will be contacted sooner.   If You Need Anything After Your Visit  If you have any questions or concerns for your doctor, please call our main line at [phone] and press option 4 to reach your doctor's medical assistant. If no one answers, please leave a voicemail as directed and we will  return your call as soon as possible. Messages left after 4 pm will be answered the following business day.   You may also send us  a message via MyChart. We typically respond to MyChart messages within 1-2 business days.  For prescription refills, please ask your pharmacy to contact our office. Our fax number is [phone].  If you have an urgent issue when the clinic is closed that cannot wait until the next business day, you can page your doctor at the number below.    Please note that while we do our best to be available for urgent issues outside of office hours, we are not  available 24/7.   If you have an urgent issue and are unable to reach us , you may choose to seek medical care at your doctor's office, retail clinic, urgent care center, or emergency room.  If you have a medical emergency, please immediately call 911 or go to the emergency department.  Pager Numbers  - Dr. Hester: [phone]  - Dr. Jackquline: [phone]  - Dr. Claudene: [phone]   - Dr. Raymund: [phone]  In the event of inclement weather, please call our main line at [phone] for an update on the status of any delays or closures.  Dermatology Medication Tips: Please keep the boxes that topical medications come in in order to help keep track of the instructions about where and how to use these. Pharmacies typically print the medication instructions only on the boxes and not directly on the medication tubes.   If your medication is too expensive, please contact our office at [phone] option 4 or send us  a message through MyChart.   We are unable to tell what your co-pay for medications will be in advance as this is different depending on your insurance coverage. However, we may be able to find a substitute medication at lower cost or fill out paperwork to get insurance to cover a needed medication.   If a prior authorization is required to get your medication covered by your insurance company, please allow us  1-2 business days to complete this process.  Drug prices often vary depending on where the prescription is filled and some pharmacies may offer cheaper prices.  The website www.goodrx.com contains coupons for medications through different pharmacies. The prices here do not account for what the cost may be with help from insurance (it may be cheaper with your insurance), but the website can give you the price if you did not use any insurance.  - You can print the associated coupon and take it with your prescription to the pharmacy.  - You may also stop by our office  during regular business hours and pick up a GoodRx coupon card.  - If you need your prescription sent electronically to a different pharmacy, notify our office through [facility] or by phone at [phone] option 4.     Si Usted Necesita Algo Despus de Su Visita  Tambin puede enviarnos un mensaje a travs de Clinical cytogeneticist. Por lo general respondemos a los mensajes de MyChart en el transcurso de 1 a 2 das hbiles.  Para renovar recetas, por favor pida a su farmacia que se ponga en contacto con nuestra oficina. Randi lakes de fax es [place] [phone].  Si tiene un asunto urgente cuando la clnica est cerrada y que no puede esperar hasta el siguiente da hbil, puede llamar/localizar a su doctor(a) al nmero que aparece a continuacin.   Por favor, tenga en cuenta que aunque hacemos todo lo  posible para estar disponibles para asuntos urgentes fuera del horario de oficina, no estamos disponibles las 24 horas del da, los 7 [address]  [address] de la [place].   Si tiene un problema urgente y no puede comunicarse con nosotros, puede optar por buscar atencin mdica  en el consultorio de su doctor(a), en una clnica privada, en un centro de atencin urgente o en una sala de emergencias.  Si tiene Engineer, drilling, por favor llame inmediatamente al 911 o vaya a la sala de emergencias.  Nmeros de bper  - Dr. Hester: [phone]  - Dra. Jackquline: [phone]  - Dr. Claudene: [phone]  - Dra. Kitts: [phone]  En caso de inclemencias del [place], por favor llame a nuestra lnea principal al [phone] para una actualizacin sobre el estado de cualquier retraso o cierre.  Consejos para la medicacin en dermatologa: Por favor, guarde las cajas en las que vienen los medicamentos de uso tpico para ayudarle a seguir las instrucciones sobre dnde y cmo usarlos. Las farmacias generalmente imprimen las instrucciones del medicamento slo en las cajas y no directamente en los tubos del  [place].   Si su medicamento es muy caro, por favor, pngase en contacto con landry rieger llamando al [phone] y presione la opcin 4 o envenos un mensaje a travs de Clinical cytogeneticist.   No podemos decirle cul ser su copago por los medicamentos por adelantado ya que esto es diferente dependiendo de la cobertura de su seguro. Sin embargo, es posible que podamos encontrar un medicamento sustituto a Audiological scientist un formulario para que el seguro cubra el medicamento que se considera necesario.   Si se requiere una autorizacin previa para que su compaa de seguros malta su medicamento, por favor permtanos de 1 a 2 das hbiles para completar este proceso.  Los precios de los medicamentos varan con frecuencia dependiendo del Environmental consultant de dnde se surte la receta y alguna farmacias pueden ofrecer precios ms baratos.  El sitio web www.goodrx.com tiene cupones para medicamentos de Health and safety inspector. Los precios aqu no tienen en cuenta lo que podra costar con la ayuda del seguro (puede ser ms barato con su seguro), pero el sitio web puede darle el precio si no utiliz Tourist information centre manager.  - Puede imprimir el cupn correspondiente y llevarlo con su receta a la farmacia.  - Tambin puede pasar por nuestra oficina durante el horario de atencin regular y Education officer, museum una tarjeta de cupones de GoodRx.  - Si necesita que su receta se enve electrnicamente a una farmacia diferente, informe a nuestra oficina a travs de MyChart de [place] o por telfono llamando al [phone] y presione la opcin 4.

## 2024-06-22 DIAGNOSIS — Z23 Encounter for immunization: Secondary | ICD-10-CM | POA: Diagnosis not present

## 2024-06-24 ENCOUNTER — Other Ambulatory Visit: Payer: Self-pay | Admitting: Family Medicine

## 2024-06-24 DIAGNOSIS — M546 Pain in thoracic spine: Secondary | ICD-10-CM

## 2024-06-24 DIAGNOSIS — M545 Low back pain, unspecified: Secondary | ICD-10-CM

## 2024-06-24 DIAGNOSIS — G629 Polyneuropathy, unspecified: Secondary | ICD-10-CM

## 2024-06-30 ENCOUNTER — Other Ambulatory Visit: Payer: Self-pay | Admitting: Family Medicine

## 2024-06-30 DIAGNOSIS — I10 Essential (primary) hypertension: Secondary | ICD-10-CM

## 2024-07-13 ENCOUNTER — Other Ambulatory Visit: Payer: Self-pay | Admitting: Family Medicine

## 2024-07-13 DIAGNOSIS — R12 Heartburn: Secondary | ICD-10-CM

## 2024-08-12 ENCOUNTER — Encounter: Payer: Self-pay | Admitting: Family Medicine

## 2024-08-17 ENCOUNTER — Ambulatory Visit: Admitting: Family Medicine

## 2024-08-24 ENCOUNTER — Encounter: Payer: Self-pay | Admitting: Family Medicine

## 2024-08-24 ENCOUNTER — Ambulatory Visit (INDEPENDENT_AMBULATORY_CARE_PROVIDER_SITE_OTHER): Admitting: Family Medicine

## 2024-08-24 VITALS — BP 166/82 | HR 89 | Temp 98.1°F | Ht 62.0 in | Wt 129.8 lb

## 2024-08-24 DIAGNOSIS — N182 Chronic kidney disease, stage 2 (mild): Secondary | ICD-10-CM | POA: Diagnosis not present

## 2024-08-24 DIAGNOSIS — N6312 Unspecified lump in the right breast, upper inner quadrant: Secondary | ICD-10-CM

## 2024-08-24 DIAGNOSIS — E0500 Thyrotoxicosis with diffuse goiter without thyrotoxic crisis or storm: Secondary | ICD-10-CM

## 2024-08-24 DIAGNOSIS — M545 Low back pain, unspecified: Secondary | ICD-10-CM

## 2024-08-24 DIAGNOSIS — I10 Essential (primary) hypertension: Secondary | ICD-10-CM

## 2024-08-24 DIAGNOSIS — R7989 Other specified abnormal findings of blood chemistry: Secondary | ICD-10-CM | POA: Diagnosis not present

## 2024-08-24 DIAGNOSIS — G8929 Other chronic pain: Secondary | ICD-10-CM | POA: Diagnosis not present

## 2024-08-24 DIAGNOSIS — E782 Mixed hyperlipidemia: Secondary | ICD-10-CM | POA: Diagnosis not present

## 2024-08-24 NOTE — Progress Notes (Signed)
 "     Established patient visit   Patient: Melanie Rhodes   DOB: 03/07/1951   73 y.o. Female  MRN: [id number] Visit Date: 08/24/2024  Today's healthcare provider: LAURAINE LOISE BUOY, DO   Chief Complaint  Patient presents with   Medical Management of Chronic Issues    Patient is here for a 6 month follow up regarding chronic management.  Wants to see if the provider will do a breast exam considering she has a raised area that has her concerned.     Subjective    HPI Melanie Rhodes is a 73 year old female who presents with a new lump in the right breast.  She discovered a lump or raised area on the right side of her chest wall after her last mammogram in June, which was normal. The lump is palpable when her breasts are not supported by a bra and is sometimes tender, though it can be difficult to locate at times. She has a history of dense breasts and previously underwent a needle biopsy on the left side, where a titanium marker was placed.  She monitors her blood pressure regularly, noting variability between home and office readings. She takes 5 mg of amlodipine  daily, though she has occasionally taken it every other day due to lightheadedness. Her blood pressure readings at home have been as low as 109, which she considers low compared to previous readings in the 130s.  She has a history of regular kidney function monitoring. She recalls a previous urine test to rule out a urinary tract infection, which was negative. No urinary symptoms such as frequency, incontinence, or pain during urination.  She experiences back pain and has been prescribed tizanidine , which she found ineffective and discontinued due to side effects. She maintains a routine of walking two to two and a half miles daily, though she sometimes needs to rest due to discomfort. She has tried various exercises for back pain relief and recently changed her mattress, which she suspects may have worsened her hip pain.  She has a  history of high cholesterol, currently managed with rosuvastatin , and her cholesterol levels are now normal. She also reports experiencing occasional hot flashes, which have been consistent since menopause.       Medications: Show/hide medication list[1]       Objective    BP (!) 166/82 (BP Location: Left Arm, Patient Position: Sitting, Cuff Size: Normal)   Pulse 89   Temp 98.1 F (36.7 C) (Oral)   Ht 5' 2 (1.575 m)   Wt 129 lb 12.8 oz (58.9 kg)   SpO2 98%   BMI 23.74 kg/m     Physical Exam Vitals and nursing note reviewed.  Constitutional:      General: She is not in acute distress.    Appearance: Normal appearance.  HENT:     Head: Normocephalic and atraumatic.  Eyes:     General: No scleral icterus.    Conjunctiva/sclera: Conjunctivae normal.  Cardiovascular:     Rate and Rhythm: Normal rate.  Pulmonary:     Effort: Pulmonary effort is normal.  Chest:  Breasts:    Tanner Score is 5.     Breasts are asymmetrical (mild, in area of mass noted).     Right: Mass and tenderness present.     Left: Normal.    Lymphadenopathy:     Upper Body:     Right upper body: No supraclavicular, axillary or pectoral adenopathy.     Left upper body: No  supraclavicular, axillary or pectoral adenopathy.  Neurological:     Mental Status: She is alert and oriented to person, place, and time. Mental status is at baseline.  Psychiatric:        Mood and Affect: Mood normal.        Behavior: Behavior normal.      Results for orders placed or performed in visit on 08/24/24  CBC w/Diff/Platelet  Result Value Ref Range   WBC 7.1 3.4 - 10.8 x10E3/uL   RBC 4.86 3.77 - 5.28 x10E6/uL   Hemoglobin 14.7 11.1 - 15.9 g/dL   Hematocrit 53.6 65.9 - 46.6 %   MCV 95 79 - 97 fL   MCH 30.2 26.6 - 33.0 pg   MCHC 31.7 31.5 - 35.7 g/dL   RDW 87.9 88.2 - 84.5 %   Platelets 142 (L) 150 - 450 x10E3/uL   Neutrophils 78 Not Estab. %   Lymphs 14 Not Estab. %   Monocytes 8 Not Estab. %   Eos 0  Not Estab. %   Basos 0 Not Estab. %   Neutrophils Absolute 5.5 1.4 - 7.0 x10E3/uL   Lymphocytes Absolute 1.0 0.7 - 3.1 x10E3/uL   Monocytes Absolute 0.6 0.1 - 0.9 x10E3/uL   EOS (ABSOLUTE) 0.0 0.0 - 0.4 x10E3/uL   Basophils Absolute 0.0 0.0 - 0.2 x10E3/uL   Immature Granulocytes 0 Not Estab. %   Immature Grans (Abs) 0.0 0.0 - 0.1 x10E3/uL  Comprehensive metabolic panel with GFR  Result Value Ref Range   Glucose 115 (H) 70 - 99 mg/dL   BUN 15 8 - 27 mg/dL   Creatinine, Ser 9.30 0.57 - 1.00 mg/dL   eGFR 92 >40 fO/fpw/8.26   BUN/Creatinine Ratio 22 12 - 28   Sodium 141 134 - 144 mmol/L   Potassium 4.5 3.5 - 5.2 mmol/L   Chloride 101 96 - 106 mmol/L   CO2 24 20 - 29 mmol/L   Calcium  10.1 8.7 - 10.3 mg/dL   Total Protein 7.2 6.0 - 8.5 g/dL   Albumin 5.0 (H) 3.8 - 4.8 g/dL   Globulin, Total 2.2 1.5 - 4.5 g/dL   Bilirubin Total 0.6 0.0 - 1.2 mg/dL   Alkaline Phosphatase 59 49 - 135 IU/L   AST 27 0 - 40 IU/L   ALT 27 0 - 32 IU/L  Iron, TIBC and Ferritin Panel  Result Value Ref Range   Total Iron Binding Capacity 370 250 - 450 ug/dL   UIBC 744 881 - 630 ug/dL   Iron 884 27 - 860 ug/dL   Iron Saturation 31 15 - 55 %   Ferritin 558 (H) 15 - 150 ng/mL  Lipid panel  Result Value Ref Range   Cholesterol, Total 182 100 - 199 mg/dL   Triglycerides 59 0 - 149 mg/dL   HDL 95 >60 mg/dL   VLDL Cholesterol Cal 11 5 - 40 mg/dL   LDL Chol Calc (NIH) 76 0 - 99 mg/dL   Chol/HDL Ratio 1.9 0.0 - 4.4 ratio  TSH  Result Value Ref Range   TSH 1.230 0.450 - 4.500 uIU/mL    Assessment & Plan    White coat syndrome with diagnosis of hypertension -     Comprehensive metabolic panel with GFR  Chronic kidney disease, stage 2, mildly decreased GFR  High serum ferritin -     CBC with Differential/Platelet -     Iron, TIBC and Ferritin Panel  Chronic bilateral low back pain without sciatica  Mass of upper  inner quadrant of right breast -     US  LIMITED ULTRASOUND INCLUDING AXILLA RIGHT BREAST;  Future -     MM 3D DIAGNOSTIC MAMMOGRAM UNILATERAL RIGHT BREAST; Future  Mixed hyperlipidemia Assessment & Plan: On Crestor  for cholesterol management. Recent lipid panel indicated good control of hyperlipidemia. - Continue Crestor  10 mg daily as prescribed   Orders: -     Lipid panel  Graves' disease in remission -     TSH       White coat syndrome with diagnosis of hypertension Chronic, elevated in clinic today.  Blood pressure lower at home than in clinic with occasional/infrequent lightheadedness. Current amlodipine  dose appropriate.  No changes today. - Continue amlodipine  5 mg daily.  Chronic kidney disease, stage 2, mildly decreased GFR Stage 2 CKD with most recent 2 GFRs between 60 and 90. - Continue monitoring kidney function with regular blood work.  High serum ferritin Ferritin levels improved. Hematologist satisfied with current levels. - Continue monitoring ferritin levels as per hematologist's recommendations.  Chronic bilateral low back pain without sciatica Persistent low back pain with radiation to hips. Tizanidine  ineffective and caused lightheadedness. Lacks targeted back exercises. - Recommended targeted back exercises. - Consider referral to orthopedics further evaluation for back pain management if not improving.  Mass of upper inner quadrant of right breast Intermittent tenderness and palpable difference in the right breast. Previous mammogram normal. Dense breast tissue and previous biopsy with titanium marker on the left. - Ordered diagnostic mammogram and directed ultrasound for right breast lump evaluation.  General health maintenance Received flu shot. Declined COVID booster. Canceled Medicare annual wellness visit. - Continue routine health maintenance and vaccinations as per personal preference.    Return in about 6 months (around 02/22/2025) for Chronic f/u w/next provider.      I discussed the assessment and treatment plan with the  patient  The patient was provided an opportunity to ask questions and all were answered. The patient agreed with the plan and demonstrated an understanding of the instructions.   The patient was advised to call back or seek an in-person evaluation if the symptoms worsen or if the condition fails to improve as anticipated.    LAURAINE LOISE BUOY, DO  [facility], [facility] [phone] (phone) [phone] (fax)  [facility]     [1]  Outpatient Medications Prior to Visit  Medication Sig Note   amLODipine  (NORVASC ) 5 MG tablet TAKE 1 TABLET (5 MG TOTAL) BY MOUTH DAILY.    Fluocinolone  Acetonide 0.01 % OIL Apply 1-2 drops to ears nightly as needed for itch and scale.    ketoconazole  (NIZORAL ) 2 % cream Apply to pink patches on face 1-2 times a day as directed until improved. (Patient taking differently: Apply 1 Application topically as needed for irritation. Apply to pink patches on face 1-2 times a day as directed until improved.)    ketoconazole  (NIZORAL ) 2 % shampoo Massage into scalp daily and let sit several minutes before rinsing. Decrease to 2-3 times weekly for maintenance. (Patient taking differently: Apply 1 Application topically as needed for irritation. Massage into scalp daily and let sit several minutes before rinsing. Decrease to 2-3 times weekly for maintenance.)    metroNIDAZOLE  (METROCREAM ) 0.75 % cream Apply topically to face for rosacea every night; use twice daily for flares.    omeprazole  (PRILOSEC) 20 MG capsule TAKE 1 CAPSULE BY MOUTH EVERY DAY    tacrolimus  (PROTOPIC ) 0.1 % ointment Apply topically to aa face and  body qd/bid prn for red scaly patches    traZODone  (DESYREL ) 100 MG tablet TAKE 0.5-1 TABLETS (50-100 MG TOTAL) BY MOUTH AT BEDTIME.    vitamin B-12 (CYANOCOBALAMIN) 1000 MCG tablet Take 1,000 mcg by mouth daily.    Vitamin D, Cholecalciferol, 25 MCG (1000 UT) CAPS Take by mouth daily. Patient takes 2 capsules daily.     [DISCONTINUED] rosuvastatin  (CRESTOR ) 10 MG tablet TAKE 1 TABLET BY MOUTH EVERY DAY    [DISCONTINUED] tiZANidine  (ZANAFLEX ) 4 MG capsule Take 1 capsule (4 mg total) by mouth 3 (three) times daily. 08/24/2024: Took it one time and was dizzy decided to stop taking.   No facility-administered medications prior to visit.   "

## 2024-08-24 NOTE — Assessment & Plan Note (Signed)
 On Crestor for cholesterol management. Recent lipid panel indicated good control of hyperlipidemia. - Continue Crestor 10 mg daily as prescribed

## 2024-08-25 ENCOUNTER — Ambulatory Visit
Admission: RE | Admit: 2024-08-25 | Discharge: 2024-08-25 | Disposition: A | Source: Ambulatory Visit | Attending: Family Medicine | Admitting: Family Medicine

## 2024-08-25 DIAGNOSIS — N6312 Unspecified lump in the right breast, upper inner quadrant: Secondary | ICD-10-CM

## 2024-08-25 LAB — IRON,TIBC AND FERRITIN PANEL
Ferritin: 558 ng/mL — ABNORMAL HIGH (ref 15–150)
Iron Saturation: 31 % (ref 15–55)
Iron: 115 ug/dL (ref 27–139)
Total Iron Binding Capacity: 370 ug/dL (ref 250–450)
UIBC: 255 ug/dL (ref 118–369)

## 2024-08-25 LAB — CBC WITH DIFFERENTIAL/PLATELET
Basophils Absolute: 0 x10E3/uL (ref 0.0–0.2)
Basos: 0 %
EOS (ABSOLUTE): 0 x10E3/uL (ref 0.0–0.4)
Eos: 0 %
Hematocrit: 46.3 % (ref 34.0–46.6)
Hemoglobin: 14.7 g/dL (ref 11.1–15.9)
Immature Grans (Abs): 0 x10E3/uL (ref 0.0–0.1)
Immature Granulocytes: 0 %
Lymphocytes Absolute: 1 x10E3/uL (ref 0.7–3.1)
Lymphs: 14 %
MCH: 30.2 pg (ref 26.6–33.0)
MCHC: 31.7 g/dL (ref 31.5–35.7)
MCV: 95 fL (ref 79–97)
Monocytes Absolute: 0.6 x10E3/uL (ref 0.1–0.9)
Monocytes: 8 %
Neutrophils Absolute: 5.5 x10E3/uL (ref 1.4–7.0)
Neutrophils: 78 %
Platelets: 142 x10E3/uL — ABNORMAL LOW (ref 150–450)
RBC: 4.86 x10E6/uL (ref 3.77–5.28)
RDW: 12 % (ref 11.7–15.4)
WBC: 7.1 x10E3/uL (ref 3.4–10.8)

## 2024-08-25 LAB — COMPREHENSIVE METABOLIC PANEL WITH GFR
ALT: 27 IU/L (ref 0–32)
AST: 27 IU/L (ref 0–40)
Albumin: 5 g/dL — ABNORMAL HIGH (ref 3.8–4.8)
Alkaline Phosphatase: 59 IU/L (ref 49–135)
BUN/Creatinine Ratio: 22 (ref 12–28)
BUN: 15 mg/dL (ref 8–27)
Bilirubin Total: 0.6 mg/dL (ref 0.0–1.2)
CO2: 24 mmol/L (ref 20–29)
Calcium: 10.1 mg/dL (ref 8.7–10.3)
Chloride: 101 mmol/L (ref 96–106)
Creatinine, Ser: 0.69 mg/dL (ref 0.57–1.00)
Globulin, Total: 2.2 g/dL (ref 1.5–4.5)
Glucose: 115 mg/dL — ABNORMAL HIGH (ref 70–99)
Potassium: 4.5 mmol/L (ref 3.5–5.2)
Sodium: 141 mmol/L (ref 134–144)
Total Protein: 7.2 g/dL (ref 6.0–8.5)
eGFR: 92 mL/min/1.73 (ref >59)

## 2024-08-25 LAB — LIPID PANEL
Chol/HDL Ratio: 1.9 ratio (ref 0.0–4.4)
Cholesterol, Total: 182 mg/dL (ref 100–199)
HDL: 95 mg/dL (ref >39)
LDL Chol Calc (NIH): 76 mg/dL (ref 0–99)
Triglycerides: 59 mg/dL (ref 0–149)
VLDL Cholesterol Cal: 11 mg/dL (ref 5–40)

## 2024-08-25 LAB — TSH: TSH: 1.23 u[IU]/mL (ref 0.450–4.500)

## 2024-08-29 ENCOUNTER — Ambulatory Visit: Payer: Self-pay | Admitting: Family Medicine

## 2024-09-09 ENCOUNTER — Other Ambulatory Visit: Payer: Self-pay | Admitting: Family Medicine

## 2024-09-09 ENCOUNTER — Encounter: Payer: Self-pay | Admitting: Family Medicine

## 2024-09-09 DIAGNOSIS — E782 Mixed hyperlipidemia: Secondary | ICD-10-CM

## 2024-09-28 ENCOUNTER — Encounter: Payer: Self-pay | Admitting: Family Medicine

## 2024-10-17 ENCOUNTER — Other Ambulatory Visit

## 2025-04-14 ENCOUNTER — Other Ambulatory Visit

## 2025-04-14 ENCOUNTER — Ambulatory Visit: Admitting: Oncology

## 2025-06-06 ENCOUNTER — Encounter: Admitting: Dermatology
